# Patient Record
Sex: Female | Born: 1970 | Race: Black or African American | Hispanic: No | Marital: Single | Smoking: Never smoker
Health system: Southern US, Community
[De-identification: ages and names within clinical notes are randomized; demographics above are authoritative.]

## PROBLEM LIST (undated history)

## (undated) DIAGNOSIS — M7120 Synovial cyst of popliteal space [Baker], unspecified knee: Secondary | ICD-10-CM

## (undated) DIAGNOSIS — K219 Gastro-esophageal reflux disease without esophagitis: Secondary | ICD-10-CM

## (undated) DIAGNOSIS — I82409 Acute embolism and thrombosis of unspecified deep veins of unspecified lower extremity: Secondary | ICD-10-CM

## (undated) DIAGNOSIS — R011 Cardiac murmur, unspecified: Secondary | ICD-10-CM

## (undated) DIAGNOSIS — B009 Herpesviral infection, unspecified: Secondary | ICD-10-CM

## (undated) DIAGNOSIS — A6004 Herpesviral vulvovaginitis: Secondary | ICD-10-CM

## (undated) HISTORY — DX: Cardiac murmur, unspecified: R01.1

## (undated) HISTORY — DX: Herpesviral vulvovaginitis: A60.04

## (undated) HISTORY — DX: Herpesviral infection, unspecified: B00.9

## (undated) HISTORY — PX: BUNIONECTOMY: SHX129

## (undated) HISTORY — DX: Gastro-esophageal reflux disease without esophagitis: K21.9

---

## 2012-11-03 ENCOUNTER — Encounter: Payer: Self-pay | Admitting: Family Medicine

## 2012-11-03 ENCOUNTER — Ambulatory Visit (INDEPENDENT_AMBULATORY_CARE_PROVIDER_SITE_OTHER): Payer: BLUE CROSS/BLUE SHIELD | Admitting: Family Medicine

## 2012-11-03 ENCOUNTER — Encounter: Payer: Self-pay | Admitting: *Deleted

## 2012-11-03 VITALS — BP 112/74 | HR 78 | Ht 63.0 in | Wt 178.0 lb

## 2012-11-03 DIAGNOSIS — L609 Nail disorder, unspecified: Secondary | ICD-10-CM

## 2012-11-03 DIAGNOSIS — N949 Unspecified condition associated with female genital organs and menstrual cycle: Secondary | ICD-10-CM

## 2012-11-03 DIAGNOSIS — Z0000 Encounter for general adult medical examination without abnormal findings: Secondary | ICD-10-CM

## 2012-11-03 DIAGNOSIS — E669 Obesity, unspecified: Secondary | ICD-10-CM

## 2012-11-03 DIAGNOSIS — N9489 Other specified conditions associated with female genital organs and menstrual cycle: Secondary | ICD-10-CM

## 2012-11-03 NOTE — Progress Notes (Signed)
  Subjective:    Patient ID: Casey Farmer, female    DOB: October 28, 1970, 42 y.o.   MRN: [id number]  HPI  Casey Farmer is here today to establish care with our practice.  She was referred to [place] by her friend [name]).  She used to receive care from Nolon Stalls who is a PA in an office that is too far for her to drive.  Overall, she feels healthy except for her weight. She would like to start a medically monitored weight loss program.    Review of Systems  Constitutional: Positive for fatigue and unexpected weight change. Negative for activity change and appetite change.  HENT: Negative.   Eyes: Negative.   Respiratory: Negative.   Cardiovascular: Negative.   Gastrointestinal: Negative.   Genitourinary: Negative.   Musculoskeletal: Negative.   Skin: Positive for wound.  Psychiatric/Behavioral: Negative.     Past Medical History  Diagnosis Date  . Type 2 HSV infection of vulvovaginal region   . Heart murmur     Family History  Problem Relation Age of Onset  . Hyperlipidemia Mother   . Hypertension Mother   . Hypertension Father   . Kidney disease Father   . Heart disease Father   . 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 No distress.  HENT:  Head: Normocephalic.  Eyes: No scleral icterus.  Neck: No thyromegaly present.  Cardiovascular: Normal rate, regular rhythm and normal heart sounds.   Pulmonary/Chest: Effort normal and breath sounds normal.  Abdominal: There is no  tenderness.  Musculoskeletal: She exhibits no edema and no tenderness.  Neurological: She is alert.  Skin: Skin is warm and dry.  Psychiatric: She has a normal mood and affect. Her behavior is normal. Judgment and thought content normal.      Assessment & Plan:

## 2012-11-03 NOTE — Patient Instructions (Addendum)
1)  Weight Loss - Read about the HCG diet in "Pounds & Inches" which will be emailed to [email or web address].     Diet Following Bariatric Surgery The bariatric diet is designed to provide fluids and nourishment while promoting weight loss after bariatric surgery. The diet is divided into 3 stages. The rate of progression varies based on individual food tolerance. DIET FOLLOWING BARIATRIC SURGERY The diet following surgery is divided into 3 stages to allow a gradual adjustment. It is very important to the success of your surgery to:  Progress to each stage slowly.  Eat at set times.  Chew food well and stop eating when you are full.  Not drink liquids 30 minutes before and after meals. If you feel tightness or pressure in your chest, that means you are full. Wait 30 minutes before you try to eat again. STAGE 1 BARIATRIC DIET - ABOUT 2 WEEKS IN DURATION   The diet begins the day of surgery. It will last about 1 to 2 weeks after surgery. Your surgeon may have individual guidelines for you about specific foods or the progression of your diet. Follow your surgeon's guidelines.  If clear liquids are well-tolerated without vomiting, your caregiver will add a 4 oz to 6 oz high protein, low-calorie liquid supplement. You could add this to your meal plan 3 times daily. You will need at least 60 g to 80 g of protein daily or as determined by your Registered Dietitian.  Guidelines for choosing a protein supplement include:  At least 15 g of protein per 8 oz serving.  Less than 20 g total carbohydrate per 8 oz serving.  Less than 5 g fat per 8 oz serving.  Avoid carbonated beverages, caffeine, alcohol, and concentrated sweets such as sugar, cakes, and cookies.  Right after surgery, you may only be able to eat 3 to 4 tsp per meal. Your maximum volume should not exceed  to  cup total. Do not eat or drink more than 1 oz or 2 tbs every 15 minutes.  Take a chewable multivitamin and mineral  supplement.  Drink at least 48 oz of fluid daily, which includes your protein supplement. Food and beverages from the list below are allowed at set times (for example at 8 AM, 12 noon, or 5 PM):  Decaffeinated coffee or tea.  100% fruit juice.  Diet or sugar-free drinks.  Broth.  Blenderized soup.  Skim milk or lactose-free milk.  Sugar-free gelatin dessert or frozen ice pops.  Mashed potatoes.  Yogurt (artificially sweetened).  Sugar-free pudding.  Blended low-fat cottage cheese.  Unsweetened applesauce, grits, or hot wheat cereal. Four to six ounces of a liquid protein supplement from the list below is recommended for snacks at 10 AM, 2 PM, and 8 PM.  STAGE 2 BARIATRIC DIET (SOFT DIET) - ABOUT 4 WEEKS IN DURATION  About 2 weeks after surgery, your caregiver will progress your diet to this stage. Foods may need to be blended to the consistency of applesauce. Choose low-fat foods (less than 5 g of fat per serving) and avoid concentrated sweets and sugar (less than 10 g of sugar per serving). Meals should not exceed  to  cup total. This stage will last about 4 weeks. It is recommended that you meet with your dietitian at this stage to begin preparation for the last stage. This stage consists of 3 meals a day with a liquid protein supplement between meals twice daily. Do not drink liquids with foods. You must wait 30  minutes for the stomach pouch to empty before drinking. Chew food well. The food must be almost liquified before swallowing. Soft foods from the list below can now be slowly added to your diet:  Soft fruit (soft canned fruit in light syrup or natural juice, banana, melon, peaches, pears, or strawberries).  Cooked vegetables.  Toast or crackers (becomes soft after chewing 20 times).  Hot wheat cereal.  Fish.  Eggs (scrambled, soft-boiled). STAGE 3 BARIATRIC DIET (REGULAR DIET) - ABOUT 6 to 8 WEEKS AFTER SURGERY About 6 to 8 weeks after surgery, you will be  advanced to food that is regular in texture. This diet should include all food groups. The diet will continue to promote weight loss. Meals should not exceed  to 1 cup total. Your dietitian will be available to assist you in meal planning and additional behavioral strategies to make this final stage a long-term success. Slowly add foods of regular consistency and remember:  Eat only at your chosen meal times.  Minimize drinking with meals. You should drink 30 minutes before eating. Do not start drinking again for about 2 hours after eating.  Chew food well. Take small bites.  Think about the portion size of a healthy frozen meal. You will be able to eat most of this.  Make sure your meal is balanced with starch, protein, fruits, and vegetables.  When you feel full, stop eating. Document Released: 10/14/2002 Document Revised: 07/02/2011 Document Reviewed: 07/07/2010 [name] Patient Information 2014 [place], [place]. vv

## 2012-11-05 LAB — CBC WITH DIFFERENTIAL/PLATELET
Basophils Absolute: 0 10*3/uL (ref 0.0–0.1)
Basophils Relative: 1 % (ref 0–1)
Eosinophils Absolute: 0.1 10*3/uL (ref 0.0–0.7)
Eosinophils Relative: 3 % (ref 0–5)
HCT: 36.6 % (ref 36.0–46.0)
Hemoglobin: 12.3 g/dL (ref 12.0–15.0)
Lymphocytes Relative: 42 % (ref 12–46)
Lymphs Abs: 2.4 10*3/uL (ref 0.7–4.0)
MCH: 29.9 pg (ref 26.0–34.0)
MCHC: 33.6 g/dL (ref 30.0–36.0)
MCV: 88.8 fL (ref 78.0–100.0)
Monocytes Absolute: 0.4 10*3/uL (ref 0.1–1.0)
Monocytes Relative: 7 % (ref 3–12)
Neutro Abs: 2.7 10*3/uL (ref 1.7–7.7)
Neutrophils Relative %: 47 % (ref 43–77)
Platelets: 253 10*3/uL (ref 150–400)
RBC: 4.12 MIL/uL (ref 3.87–5.11)
RDW: 14.2 % (ref 11.5–15.5)
WBC: 5.6 10*3/uL (ref 4.0–10.5)

## 2012-11-05 LAB — COMPLETE METABOLIC PANEL WITH GFR
ALT: 16 U/L (ref 0–35)
AST: 20 U/L (ref 0–37)
Albumin: 4 g/dL (ref 3.5–5.2)
Alkaline Phosphatase: 70 U/L (ref 39–117)
BUN: 10 mg/dL (ref 6–23)
CO2: 31 mEq/L (ref 19–32)
Calcium: 9.4 mg/dL (ref 8.4–10.5)
Chloride: 101 mEq/L (ref 96–112)
Creat: 0.85 mg/dL (ref 0.50–1.10)
GFR, Est African American: >89 mL/min
GFR, Est Non African American: 85 mL/min
Glucose, Bld: 76 mg/dL (ref 70–99)
Potassium: 4 mEq/L (ref 3.5–5.3)
Sodium: 136 mEq/L (ref 135–145)
Total Bilirubin: 0.4 mg/dL (ref 0.3–1.2)
Total Protein: 7.1 g/dL (ref 6.0–8.3)

## 2012-11-05 LAB — LIPID PANEL
Cholesterol: 174 mg/dL (ref 0–200)
HDL: 54 mg/dL (ref >39)
LDL Cholesterol: 106 mg/dL — ABNORMAL HIGH (ref 0–99)
Total CHOL/HDL Ratio: 3.2 Ratio
Triglycerides: 72 mg/dL (ref <150)
VLDL: 14 mg/dL (ref 0–40)

## 2012-11-05 LAB — TSH: TSH: 0.754 u[IU]/mL (ref 0.350–4.500)

## 2012-11-05 LAB — HSV(HERPES SIMPLEX VRS) I + II AB-IGG
HSV 1 Glycoprotein G Ab, IgG: 5.94 IV — ABNORMAL HIGH
HSV 2 Glycoprotein G Ab, IgG: 5.57 IV — ABNORMAL HIGH

## 2012-11-05 LAB — VITAMIN D 25 HYDROXY (VIT D DEFICIENCY, FRACTURES): Vit D, 25-Hydroxy: 27 ng/mL — ABNORMAL LOW (ref 30–89)

## 2012-11-17 ENCOUNTER — Encounter: Payer: Self-pay | Admitting: Family Medicine

## 2012-11-17 DIAGNOSIS — N949 Unspecified condition associated with female genital organs and menstrual cycle: Secondary | ICD-10-CM | POA: Insufficient documentation

## 2012-11-17 DIAGNOSIS — E669 Obesity, unspecified: Secondary | ICD-10-CM | POA: Insufficient documentation

## 2012-11-17 DIAGNOSIS — Z0000 Encounter for general adult medical examination without abnormal findings: Secondary | ICD-10-CM | POA: Insufficient documentation

## 2012-11-17 DIAGNOSIS — L609 Nail disorder, unspecified: Secondary | ICD-10-CM | POA: Insufficient documentation

## 2012-11-17 NOTE — Assessment & Plan Note (Signed)
She is interested in losing weight. A copy of "Pounds & Inches" was mailed to her.

## 2012-11-17 NOTE — Assessment & Plan Note (Signed)
Checking to confirm if she has HSV 1 or 2 or both.

## 2012-11-17 NOTE — Assessment & Plan Note (Addendum)
She applies Nuvail to her nails.

## 2012-11-17 NOTE — Assessment & Plan Note (Signed)
She is to return for a CPE and to go over her labs.

## 2012-11-18 ENCOUNTER — Encounter: Payer: Self-pay | Admitting: Family Medicine

## 2012-11-18 ENCOUNTER — Ambulatory Visit (INDEPENDENT_AMBULATORY_CARE_PROVIDER_SITE_OTHER): Payer: BC Managed Care – PPO | Admitting: Family Medicine

## 2012-11-18 VITALS — BP 108/68 | HR 73 | Resp 16 | Ht 63.0 in | Wt 179.0 lb

## 2012-11-18 DIAGNOSIS — K140 Glossitis: Secondary | ICD-10-CM

## 2012-11-18 DIAGNOSIS — B009 Herpesviral infection, unspecified: Secondary | ICD-10-CM

## 2012-11-18 MED ORDER — FAMCICLOVIR 500 MG PO TABS: ORAL_TABLET | ORAL | Status: DC

## 2012-11-18 MED ORDER — ACYCLOVIR-HYDROCORTISONE 5-1 % EX CREA: TOPICAL_CREAM | CUTANEOUS | Status: DC

## 2012-11-18 MED ORDER — VALACYCLOVIR HCL 1 G PO TABS: ORAL_TABLET | ORAL | Status: DC

## 2012-11-18 MED ORDER — CLOTRIMAZOLE 10 MG MT TROC: 10.0000 mg | Freq: Every day | OROMUCOSAL | Status: DC

## 2012-11-18 NOTE — Patient Instructions (Addendum)
1)  Herpes I/II   Herpes I   Valtrex - 2 Grams at onset of symptoms and repeat in 12 hours x 1 day (Treatment)  Herpes II  Valtrex - 1/2 tab twice a day for 3 days (Treatment)  Valtrex - 1/2 - 1 daily for prevention    Famvir is not officially indicated for PREVENTION OF TRANSMISSION.    A supplement that can prevent outbreaks is Lysine.    A topical that can help is Nolon Rod that you use 5 x per day.      Herpes Labialis You have a fever blister or cold sore (herpes labialis). These painful, grouped sores are caused by one of the herpes viruses (HSV1 most commonly). They are usually found around the lips and mouth, but the same infection can also affect other areas on the face such as the nose and eyes. Herpes infections take about 10 days to heal. They often occur again and again in the same spot. Other symptoms may include numbness and tingling in the involved skin, achiness, fever, and swollen glands in the neck. Colds, emotional stress, injuries, or excess sunlight exposure all seem to make herpes reappear. Herpes lip infections are contagious. Direct contact with these sores can spread the infection. It can also be spread to other parts of your own body. TREATMENT  Herpes labialis is usually self-limited and resolves within 1 week. To reduce pain and swelling, apply ice packs frequently to the sores or suck on popsicles or frozen juice bars. Antiviral medicine may be used by mouth to shorten the duration of the breakout. Avoid spreading the infection by washing your hands often. Be careful not to touch your eyes or genital areas after handling the infected blisters. Do not kiss or have other intimate contact with others. After the blisters are completely healed you may resume contact. Use sunscreen to lessen recurrences.  If this is your first infection with herpes, or if you have a severe or repeated infections, your caregiver may prescribe one of the anti-viral drugs to speed up the  healing. If you have sun-related flare-ups despite the use of sunscreen, starting oral anti-viral medicine before a prolonged exposure (going skiing or to the beach) can prevent most episodes.  SEEK IMMEDIATE MEDICAL CARE IF:  You develop a headache, sleepiness, high fever, vomiting, or severe weakness.  You have eye irritation, pain, blurred vision or redness.  You develop a prolonged infection not getting better in 10 days. Document Released: 04/09/2005 Document Revised: 07/02/2011 Document Reviewed: 02/11/2009 [facility] Patient Information 2014 [place], [place].  Genital Herpes Genital herpes is a sexually transmitted disease. This means that it is a disease passed by having sex with an infected person. There is no cure for genital herpes. The time between attacks can be months to years. The virus may live in a person but produce no problems (symptoms). This infection can be passed to a baby as it travels down the birth canal (vagina). In a newborn, this can cause central nervous system damage, eye damage, or even death. The virus that causes genital herpes is usually HSV-2 virus. The virus that causes oral herpes is usually HSV-1. The diagnosis (learning what is wrong) is made through culture results. SYMPTOMS  Usually symptoms of pain and itching begin a few days to a week after contact. It first appears as small blisters that progress to small painful ulcers which then scab over and heal after several days. It affects the outer genitalia, birth canal, cervix, penis, anal  area, buttocks, and thighs. HOME CARE INSTRUCTIONS   Keep ulcerated areas dry and clean.  Take medications as directed. Antiviral medications can speed up healing. They will not prevent recurrences or cure this infection. These medications can also be taken for suppression if there are frequent recurrences.  While the infection is active, it is contagious. Avoid all sexual contact during active infections.  Condoms may  help prevent spread of the herpes virus.  Practice safe sex.  Wash your hands thoroughly after touching the genital area.  Avoid touching your eyes after touching your genital area.  Inform your caregiver if you have had genital herpes and become pregnant. It is your responsibility to insure a safe outcome for your baby in this pregnancy.  Only take over-the-counter or prescription medicines for pain, discomfort, or fever as directed by your caregiver. SEEK MEDICAL CARE IF:   You have a recurrence of this infection.  You do not respond to medications and are not improving.  You have new sources of pain or discharge which have changed from the original infection.  You have an oral temperature above 102 F (38.9 C).  You develop abdominal pain.  You develop eye pain or signs of eye infection. Document Released: 04/06/2000 Document Revised: 07/02/2011 Document Reviewed: 04/27/2009 [facility] Patient Information 2014 [place], [place].

## 2012-11-18 NOTE — Progress Notes (Signed)
  Subjective:    Patient ID: Casey Farmer, female    DOB: 09/05/1970, 42 y.o.   MRN: [id number]  HPI  [name] is here today to go over her lab results and to discuss the condition listed below:   1)  HSV:  She does not currently take anything for this.    2)  Tongue Problem:  She has developed some dark spots on her tongue and thinks that it might be a fungal infection.       Review of Systems  Constitutional: Negative for activity change, fatigue and unexpected weight change.       Brown spot in her tongue  HENT: Negative.   Eyes: Negative.   Respiratory: Negative for shortness of breath.   Cardiovascular: Negative for chest pain, palpitations and leg swelling.  Gastrointestinal: Negative for diarrhea and constipation.  Endocrine: Negative.   Genitourinary: Negative for difficulty urinating.  Musculoskeletal: Negative.   Skin: Negative for wound.  Neurological: Negative.   Hematological: Negative for adenopathy. Does not bruise/bleed easily.  Psychiatric/Behavioral: Negative for sleep disturbance and dysphoric mood. The patient is not nervous/anxious.     Past Medical History  Diagnosis Date  . Type 2 HSV infection of vulvovaginal region   . Heart murmur   . HSV-1 (herpes simplex virus 1) infection     Family History  Problem Relation Age of Onset  . Hyperlipidemia Mother   . Hypertension Mother   . Hypertension Father   . Kidney disease Father     S/P Kidney Transplant   . Heart disease Father   . 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 Constitutional: She is oriented to person, place, and time.  HENT:  Mouth/Throat:    Eyes: Conjunctivae are normal. No scleral icterus.  Neck: Neck supple. No thyromegaly present.  Cardiovascular: Normal rate, regular rhythm and normal heart sounds.   Pulmonary/Chest: Effort normal and breath sounds normal.  Musculoskeletal: She exhibits no edema and no tenderness.  Lymphadenopathy:    She has no cervical adenopathy.  Neurological: She is alert and oriented to person, place, and time.  Skin: Skin is warm and dry.  Psychiatric: She has a normal mood and affect. Her behavior is normal. Judgment and thought content normal.          Assessment & Plan:

## 2013-01-04 ENCOUNTER — Encounter: Payer: Self-pay | Admitting: Family Medicine

## 2013-01-04 DIAGNOSIS — B009 Herpesviral infection, unspecified: Secondary | ICD-10-CM | POA: Insufficient documentation

## 2013-01-05 DIAGNOSIS — K140 Glossitis: Secondary | ICD-10-CM | POA: Insufficient documentation

## 2013-01-07 NOTE — Assessment & Plan Note (Signed)
She was given prescriptions for Famvir and Nolon Rod.

## 2013-01-07 NOTE — Assessment & Plan Note (Signed)
She has been on Cleocin which may have caused a fungal infection.  She was given Mycelex troches to help with this problem.

## 2013-01-07 NOTE — Assessment & Plan Note (Signed)
We discussed the fact that she has tested positive for both HSV 1 & 2.  I explained to her that Valtrex is the medication she needs to take to prevent transmission of the HSV.

## 2013-01-21 ENCOUNTER — Encounter (HOSPITAL_BASED_OUTPATIENT_CLINIC_OR_DEPARTMENT_OTHER): Payer: Self-pay | Admitting: Emergency Medicine

## 2013-01-21 ENCOUNTER — Emergency Department (HOSPITAL_BASED_OUTPATIENT_CLINIC_OR_DEPARTMENT_OTHER)
Admission: EM | Admit: 2013-01-21 | Discharge: 2013-01-21 | Disposition: A | Discharge status: Home / Self Care | Payer: BC Managed Care – PPO | Attending: Emergency Medicine | Admitting: Emergency Medicine

## 2013-01-21 DIAGNOSIS — F4489 Other dissociative and conversion disorders: Secondary | ICD-10-CM | POA: Insufficient documentation

## 2013-01-21 DIAGNOSIS — Z792 Long term (current) use of antibiotics: Secondary | ICD-10-CM | POA: Insufficient documentation

## 2013-01-21 DIAGNOSIS — Z8619 Personal history of other infectious and parasitic diseases: Secondary | ICD-10-CM | POA: Insufficient documentation

## 2013-01-21 DIAGNOSIS — R011 Cardiac murmur, unspecified: Secondary | ICD-10-CM | POA: Insufficient documentation

## 2013-01-21 DIAGNOSIS — M7122 Synovial cyst of popliteal space [Baker], left knee: Secondary | ICD-10-CM

## 2013-01-21 DIAGNOSIS — Z880 Allergy status to penicillin: Secondary | ICD-10-CM | POA: Insufficient documentation

## 2013-01-21 DIAGNOSIS — M7120 Synovial cyst of popliteal space [Baker], unspecified knee: Secondary | ICD-10-CM | POA: Insufficient documentation

## 2013-01-21 DIAGNOSIS — Z79899 Other long term (current) drug therapy: Secondary | ICD-10-CM | POA: Insufficient documentation

## 2013-01-21 LAB — D-DIMER, QUANTITATIVE: D-Dimer, Quant: <0.27 ug/mL-FEU (ref 0.00–0.48)

## 2013-01-21 NOTE — ED Provider Notes (Signed)
CSN: [id number]     Arrival date & time 01/21/13  1448 History   First MD Initiated Contact with Patient 01/21/13 1459     Chief Complaint  Patient presents with  . Leg Swelling   (Consider location/radiation/quality/duration/timing/severity/associated sxs/prior Treatment) HPI  This is a 42 year old female who presents with one month of left ankle swelling, one day of posterior knee swelling and pain as well as burning sensation. Patient is currently on clindamycin following a root canal. Patient states that when she gets on antibiotics she frequently has a burning sensation throughout her body. This is not new.  Patient reports a one-month history of mild left lower extremity swelling. She is seeing a Dr. who encouraged her to reduce her salt intake. Patient noted yesterday a swelling to the posterior left knee. She denies any injury. She denies any manual labor. Patient states that she is scared that she may have a blood clot. She has no history of blood clots, she's not currently on estrogen, she has no recent history of travel or hospitalization. She denies any chest pain or shortness of breath.  Past Medical History  Diagnosis Date  . Type 2 HSV infection of vulvovaginal region   . Heart murmur   . HSV-1 (herpes simplex virus 1) infection    Past Surgical History  Procedure Laterality Date  . Bunionectomy Bilateral    Family History  Problem Relation Age of Onset  . Hyperlipidemia Mother   . Hypertension Mother   . Hypertension Father   . Kidney disease Father     S/P Kidney Transplant   . Heart disease Father   . Cancer Maternal Grandmother    History  Substance Use Topics  . Smoking status: Never Smoker   . Smokeless tobacco: Never Used  . Alcohol Use: 0.0 oz/week    .5 drink(s) per week     Comment: Occasionally   OB History   Grav Para Term Preterm Abortions TAB SAB Ect Mult Living                 Review of Systems  Constitutional: Negative for fever.   Respiratory: Negative for cough, chest tightness and shortness of breath.   Cardiovascular: Negative for chest pain.  Gastrointestinal: Negative for abdominal pain.  Genitourinary: Negative for dysuria.  Musculoskeletal: Negative for back pain and joint swelling.  Psychiatric/Behavioral: Positive for confusion.  All other systems reviewed and are negative.    Allergies  Amoxicillin  Home Medications   Current Outpatient Rx  Name  Route  Sig  Dispense  Refill  . Acyclovir-Hydrocortisone 5-1 % CREA      Apply to herpes lesion up to 5 x per day   5 g   11   . clindamycin (CLEOCIN) 150 MG capsule               . clotrimazole (MYCELEX) 10 MG troche   Oral   Take 1 tablet (10 mg total) by mouth 5 (five) times daily.   35 tablet   0   . Dermatological Products, Misc. (NUVAIL) SOLN               . famciclovir (FAMVIR) 500 MG tablet      Take 1/2 - 1 tab daily for prevention or 3 tabs at onset of symptoms x 1 day   30 tablet   11   . valACYclovir (VALTREX) 1000 MG tablet               .  valACYclovir (VALTREX) 1000 MG tablet      Take 1/2 - 1 tab daily for prevention or as directed for treatment   30 tablet   11    BP 137/59  Pulse 90  Temp(Src) 98.6 F (37 C) (Oral)  Resp 18  Wt 179 lb (81.194 kg)  BMI 31.72 kg/m2  SpO2 100%  LMP 01/10/2013 Physical Exam  Nursing note and vitals reviewed. Constitutional: She is oriented to person, place, and time. She appears well-developed and well-nourished. No distress.  HENT:  Head: Normocephalic and atraumatic.  Cardiovascular: Normal rate, regular rhythm and normal heart sounds.   No murmur heard. Pulmonary/Chest: Effort normal and breath sounds normal. No respiratory distress.  Abdominal: Soft. There is no tenderness.  Musculoskeletal:  Minimal swelling noted to the left ankle. Good DP pulses.  There is fullness and tenderness to palpation to the left popliteal fossa.  No unilateral calf swelling. No  tenderness to palpation of the calves.  Neurological: She is alert and oriented to person, place, and time.  Skin: Skin is warm and dry.  Psychiatric: She has a normal mood and affect.    ED Course  Procedures (including critical care time) Labs Review Labs Reviewed  D-DIMER, QUANTITATIVE   Imaging Review No results found.  MDM   1. Baker's cyst of knee, left     This is a 42 year old female who presents with concerns for a DVT. She is low-risk for DVT and clinically there is no swelling of the calf. Patient is a fullness to the popliteal fossa but presentation is more consistent with a Baker's cyst. D-dimer was sent.  D-dimer is negative. At this time patient does not need further workup for DVT. Patient will followup with primary care physician.  After history, exam, and medical workup I feel the patient has been appropriately medically screened and is safe for discharge home. Pertinent diagnoses were discussed with the patient. Patient was given return precautions.     Shon Baton, MD 01/21/13 2123

## 2013-01-21 NOTE — ED Notes (Signed)
MD at bedside. 

## 2013-01-21 NOTE — ED Notes (Signed)
Pt reports swelling behind knees x 1 mo, today she reports burning sensation as well. Taking antibiotics for root canal.

## 2013-02-02 ENCOUNTER — Ambulatory Visit (INDEPENDENT_AMBULATORY_CARE_PROVIDER_SITE_OTHER): Payer: BC Managed Care – PPO | Admitting: Family Medicine

## 2013-02-02 ENCOUNTER — Encounter: Payer: Self-pay | Admitting: Family Medicine

## 2013-02-02 VITALS — BP 102/68 | HR 80 | Resp 16 | Wt 178.0 lb

## 2013-02-02 DIAGNOSIS — M549 Dorsalgia, unspecified: Secondary | ICD-10-CM

## 2013-02-02 DIAGNOSIS — M7122 Synovial cyst of popliteal space [Baker], left knee: Secondary | ICD-10-CM

## 2013-02-02 DIAGNOSIS — Z23 Encounter for immunization: Secondary | ICD-10-CM

## 2013-02-02 DIAGNOSIS — M7120 Synovial cyst of popliteal space [Baker], unspecified knee: Secondary | ICD-10-CM

## 2013-02-02 DIAGNOSIS — J309 Allergic rhinitis, unspecified: Secondary | ICD-10-CM

## 2013-02-02 MED ORDER — MOMETASONE FUROATE 50 MCG/ACT NA SUSP: NASAL | Status: DC

## 2013-02-02 MED ORDER — MONTELUKAST SODIUM 10 MG PO TABS: 10.0000 mg | ORAL_TABLET | Freq: Every day | ORAL | Status: DC

## 2013-02-02 MED ORDER — MELOXICAM 7.5 MG PO TABS: 7.5000 mg | ORAL_TABLET | Freq: Every day | ORAL | Status: DC

## 2013-02-02 NOTE — Patient Instructions (Addendum)
1)  Leg Pain/Swelling - Call and schedule an appointment with [facility] to evaluate left leg for ? Baker's Cyst.  In the meantime, you can take an anti-inflammatory and wrap your knee with Cobain and elevate as much as you can.    2)  Back Pain - Take a combination of the anti-inflammatory and add Tylenol.  Use the Back Magic as we discussed 5-10 mins 1-2 times per day.  3)  Nasal Congestion - Rinse out nasal passage with Lloyd Huger Med Sinus Rinse (Distilled Water and Viacom).    Nasal Swelling - Nasal Steroid Sprays (Nasonex vs Nasacort)  Congestion - Singulair +/- Oral Decongestant (Pseudoephedrine or Phenylephrine) You can get plain or mixed e.g. Zyrtec D/Noral AD  Runny, Itching, Sneezing - Antihistamine (Zyrtec vs Noral AD)    Sinus Headache A sinus headache is when your sinuses become clogged or swollen. Sinus headaches can range from mild to severe.  CAUSES A sinus headache can have different causes, such as:  Colds.  Sinus infections.  Allergies. SYMPTOMS  Symptoms of a sinus headache may vary and can include:  Headache.  Pain or pressure in the face.  Congested or runny nose.  Fever.  Inability to smell.  Pain in upper teeth. Weather changes can make symptoms worse. TREATMENT  The treatment of a sinus headache depends on the cause.  Sinus pain caused by a sinus infection may be treated with antibiotic medicine.  Sinus pain caused by allergies may be helped by allergy medicines (antihistamines) and medicated nasal sprays.  Sinus pain caused by congestion may be helped by flushing the nose and sinuses with saline solution. HOME CARE INSTRUCTIONS   If antibiotics are prescribed, take them as directed. Finish them even if you start to feel better.  Only take over-the-counter or prescription medicines for pain, discomfort, or fever as directed by your caregiver.  If you have congestion, use a nasal spray to help reduce pressure. SEEK IMMEDIATE MEDICAL CARE  IF:  You have a fever.  You have headaches more than once a week.  You have sensitivity to light or sound.  You have repeated nausea and vomiting.  You have vision problems.  You have sudden, severe pain in your face or head.  You have a seizure.  You are confused.  Your sinus headaches do not get better after treatment. Many people think they have a sinus headache when they actually have migraines or tension headaches. MAKE SURE YOU:   Understand these instructions.  Will watch your condition.  Will get help right away if you are not doing well or get worse. Document Released: 05/17/2004 Document Revised: 07/02/2011 Document Reviewed: 07/08/2010 [facility] Patient Information 2014 [place], [place].

## 2013-02-02 NOTE — Progress Notes (Signed)
Subjective:    Patient ID: Casey Farmer, female    DOB: 09-28-1970, 42 y.o.   MRN: [id number]  HPI  Chelly is here today to discuss the conditions listed below:   1)  Baker's Cyst:  She first noticed a "swelling" in the back of her left knee about three weeks ago.  She was worried that she might have a blood clot so she went to the ED downstairs to have it evaluated.  She was told that she did not have a blood clot but that she most likely has a "Baker's Cyst".  She was instructed to follow up with her PCP.   2)  Back Pain:  She has been having pain in the middle of her back for several months.  She says that she feels that the pain is coming from poor posture and from working at a small deck in her home.    3)  Sinus Pressure/Congestion:  She has trouble with her sinuses and wants to be referred to an ENT.      Review of Systems  Constitutional: Negative.   HENT: Positive for congestion.   Eyes: Negative.   Respiratory: Negative.   Cardiovascular: Negative.   Gastrointestinal: Negative.   Endocrine: Negative.   Genitourinary: Negative.   Musculoskeletal: Positive for back pain.       Swelling in the back of her left knee   Allergic/Immunologic: Negative.   Neurological: Negative.   Hematological: Negative.   Psychiatric/Behavioral: Negative.      Past Medical History  Diagnosis Date  . Type 2 HSV infection of vulvovaginal region   . Heart murmur   . HSV-1 (herpes simplex virus 1) infection       Family History  Problem Relation Age of Onset  . Hyperlipidemia Mother   . Hypertension Mother   . Hypertension Father   . Kidney disease Father     S/P Kidney Transplant   . Heart disease Father   . Cancer Maternal Grandmother        Objective:   Physical Exam  Vitals reviewed. Constitutional: She is oriented to person, place, and time. She appears well-developed and well-nourished. No distress.  HENT:  Head: Normocephalic.  Mouth/Throat: No oropharyngeal  exudate.  Eyes: Conjunctivae are normal. Right eye exhibits no discharge. Left eye exhibits no discharge.  Neck: Neck supple.  Cardiovascular: Normal rate, regular rhythm and normal heart sounds.  Exam reveals no gallop and no friction rub.   No murmur heard. Pulmonary/Chest: Effort normal and breath sounds normal. She has no wheezes. She exhibits no tenderness.  Musculoskeletal: She exhibits edema (Mild swelling behind left knee) and tenderness.       Arms: Lymphadenopathy:    She has no cervical adenopathy.  Neurological: She is alert and oriented to person, place, and time.  Skin: Skin is warm and dry. No rash noted.  Psychiatric: She has a normal mood and affect.      Assessment & Plan:    Keyly was seen today for cyst and back pain.  Diagnoses and associated orders for this visit:  Baker's cyst of knee, left Comments: She is to call [facility] and schedule an appointment.    Backache Comments: She was given a prescription for Mobic.   - meloxicam (MOBIC) 7.5 MG tablet; Take 1 tablet (7.5 mg total) by mouth daily.  Allergic rhinitis Comments: We discussed several options for treatment for her nasal/sinus symptoms.   - mometasone (NASONEX) 50 MCG/ACT nasal spray; Spray twice into  each nostril at night for nasal/sinus congestion - montelukast (SINGULAIR) 10 MG tablet; Take 1 tablet (10 mg total) by mouth at bedtime.  Need for prophylactic vaccination and inoculation against influenza Comments: Flu shot was given without difficulty.  - Flu Vaccine QUAD 36+ mos PF IM (Fluarix)

## 2013-03-03 ENCOUNTER — Ambulatory Visit: Payer: BC Managed Care – PPO | Admitting: Family Medicine

## 2013-03-03 ENCOUNTER — Encounter: Payer: BC Managed Care – PPO | Admitting: Family Medicine

## 2013-03-03 ENCOUNTER — Encounter: Payer: Self-pay | Admitting: Family Medicine

## 2013-03-03 ENCOUNTER — Ambulatory Visit (INDEPENDENT_AMBULATORY_CARE_PROVIDER_SITE_OTHER): Payer: BC Managed Care – PPO | Admitting: Family Medicine

## 2013-03-03 VITALS — BP 125/80 | HR 69 | Resp 16 | Wt 178.0 lb

## 2013-03-03 DIAGNOSIS — M25562 Pain in left knee: Secondary | ICD-10-CM

## 2013-03-03 DIAGNOSIS — M25569 Pain in unspecified knee: Secondary | ICD-10-CM

## 2013-03-03 NOTE — Patient Instructions (Signed)
1 

## 2013-03-03 NOTE — Progress Notes (Signed)
  Subjective:    Patient ID: Casey Farmer, female    DOB: 14-Mar-1971, 42 y.o.   MRN: [id number]  HPI  Casey Farmer is here today concerned about her left knee. She has been seen several times for pain and swelling.  She feels a knot behind it. She was told it was probably a Bakers Cyst. She just wants to be sure it's nothing more serious. She is also concerned about her tongue. She says that it has indentions.     Review of Systems  Constitutional: Negative.   HENT: Negative.   Eyes: Negative.   Respiratory: Negative.   Cardiovascular: Negative.   Gastrointestinal: Negative.   Endocrine: Negative.   Genitourinary: Negative.   Musculoskeletal:       Swelling behind left knee  Skin: Negative.   Allergic/Immunologic: Negative.   Neurological: Negative.   Hematological: Negative.   Psychiatric/Behavioral: Negative.      Past Medical History  Diagnosis Date  . Type 2 HSV infection of vulvovaginal region   . Heart murmur   . HSV-1 (herpes simplex virus 1) infection      Family History  Problem Relation Age of Onset  . Hyperlipidemia Mother   . Hypertension Mother   . Hypertension Father   . Kidney disease Father     S/P Kidney Transplant   . Heart disease Father   . 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    Education: Oncologist [facility]); [employer] (Associate's Degree) IT    Tobacco Use/Exposure:  None    Alcohol Use:  Occasional   Drug Use:  None   Diet:  Regular   Exercise:  Walking and Aerobics - Twice daily   Hobbies: Shopping and cooking                        Objective:   Physical Exam  Nursing note and vitals reviewed. Constitutional: She is oriented to person, place, and time. She appears well-developed and well-nourished.   HENT:  Head: Normocephalic.  Eyes: Pupils are equal, round, and reactive to light.  Neck: Normal range of motion.  Cardiovascular: Normal rate.   Pulmonary/Chest: Effort normal.  Abdominal: Soft.  Musculoskeletal: Normal range of motion.  Neurological: She is alert and oriented to person, place, and time.  Skin: Skin is warm and dry.  Psychiatric: She has a normal mood and affect. Her behavior is normal. Judgment and thought content normal.      Assessment & Plan:    Casey Farmer was seen today for knee pain.  Diagnoses and associated orders for this visit:  Knee pain, acute, left Comments: She was given the name and numbers for several orthopedists that she might want to see.     TIME SPENT "FACE TO FACE" WITH PATIENT -  15 MINS

## 2013-03-09 NOTE — Progress Notes (Signed)
This encounter was created in error - please disregard.

## 2013-03-17 ENCOUNTER — Ambulatory Visit: Payer: BC Managed Care – PPO | Admitting: Family Medicine

## 2013-03-23 ENCOUNTER — Other Ambulatory Visit: Payer: Self-pay | Admitting: Family Medicine

## 2013-03-27 ENCOUNTER — Encounter: Payer: Self-pay | Admitting: Family Medicine

## 2013-03-27 DIAGNOSIS — Z23 Encounter for immunization: Secondary | ICD-10-CM | POA: Insufficient documentation

## 2013-03-27 NOTE — Assessment & Plan Note (Signed)
The patient confirmed that they are not allergic to eggs and have never had a bad reaction with the flu shot in the past.  The vaccination was given without difficulty.   

## 2013-04-20 DIAGNOSIS — J309 Allergic rhinitis, unspecified: Secondary | ICD-10-CM | POA: Insufficient documentation

## 2013-04-20 DIAGNOSIS — M549 Dorsalgia, unspecified: Secondary | ICD-10-CM | POA: Insufficient documentation

## 2013-04-20 DIAGNOSIS — M7120 Synovial cyst of popliteal space [Baker], unspecified knee: Secondary | ICD-10-CM | POA: Insufficient documentation

## 2013-04-21 LAB — HM MAMMOGRAPHY: HM MAMMO: NEGATIVE

## 2013-04-24 ENCOUNTER — Encounter: Payer: Self-pay | Admitting: *Deleted

## 2013-05-22 ENCOUNTER — Encounter: Payer: Self-pay | Admitting: Family Medicine

## 2013-05-22 ENCOUNTER — Ambulatory Visit (INDEPENDENT_AMBULATORY_CARE_PROVIDER_SITE_OTHER): Payer: BC Managed Care – PPO | Admitting: Family Medicine

## 2013-05-22 VITALS — BP 118/74 | HR 71 | Resp 16 | Wt 175.0 lb

## 2013-05-22 DIAGNOSIS — K219 Gastro-esophageal reflux disease without esophagitis: Secondary | ICD-10-CM

## 2013-05-22 MED ORDER — RABEPRAZOLE SODIUM 20 MG PO TBEC: 20.0000 mg | DELAYED_RELEASE_TABLET | Freq: Every day | ORAL | Status: DC

## 2013-05-22 NOTE — Patient Instructions (Signed)
1)  GERD -   Mylanta, Maalox, Tums, Rolaids, Baking Soda (1 tsp) 8 oz water; GI Cocktail (Mylanta, Zantac, Bicarb, Levsin)   Zantac 150 mg 1-2 x per day or Pepcid 20 1-2 x per day  Omeprazole 20 mg (1-2) per day or the Aciphex 20 mg daily.     Gastroesophageal Reflux Disease, Adult Gastroesophageal reflux disease (GERD) happens when acid from your stomach flows up into the esophagus. When acid comes in contact with the esophagus, the acid causes soreness (inflammation) in the esophagus. Over time, GERD may create small holes (ulcers) in the lining of the esophagus. CAUSES   Increased body weight. This puts pressure on the stomach, making acid rise from the stomach into the esophagus.  Smoking. This increases acid production in the stomach.  Drinking alcohol. This causes decreased pressure in the lower esophageal sphincter (valve or ring of muscle between the esophagus and stomach), allowing acid from the stomach into the esophagus.  Late evening meals and a full stomach. This increases pressure and acid production in the stomach.  A malformed lower esophageal sphincter. Sometimes, no cause is found. SYMPTOMS   Burning pain in the lower part of the mid-chest behind the breastbone and in the mid-stomach area. This may occur twice a week or more often.  Trouble swallowing.  Sore throat.  Dry cough.  Asthma-like symptoms including chest tightness, shortness of breath, or wheezing. DIAGNOSIS  Your caregiver may be able to diagnose GERD based on your symptoms. In some cases, X-rays and other tests may be done to check for complications or to check the condition of your stomach and esophagus. TREATMENT  Your caregiver may recommend over-the-counter or prescription medicines to help decrease acid production. Ask your caregiver before starting or adding any new medicines.  HOME CARE INSTRUCTIONS   Change the factors that you can control. Ask your caregiver for guidance concerning  weight loss, quitting smoking, and alcohol consumption.  Avoid foods and drinks that make your symptoms worse, such as:  Caffeine or alcoholic drinks.  Chocolate.  Peppermint or mint flavorings.  Garlic and onions.  Spicy foods.  Citrus fruits, such as oranges, lemons, or limes.  Tomato-based foods such as sauce, chili, salsa, and pizza.  Fried and fatty foods.  Avoid lying down for the 3 hours prior to your bedtime or prior to taking a nap.  Eat small, frequent meals instead of large meals.  Wear loose-fitting clothing. Do not wear anything tight around your waist that causes pressure on your stomach.  Raise the head of your bed 6 to 8 inches with wood blocks to help you sleep. Extra pillows will not help.  Only take over-the-counter or prescription medicines for pain, discomfort, or fever as directed by your caregiver.  Do not take aspirin, ibuprofen, or other nonsteroidal anti-inflammatory drugs (NSAIDs). SEEK IMMEDIATE MEDICAL CARE IF:   You have pain in your arms, neck, jaw, teeth, or back.  Your pain increases or changes in intensity or duration.  You develop nausea, vomiting, or sweating (diaphoresis).  You develop shortness of breath, or you faint.  Your vomit is green, yellow, black, or looks like coffee grounds or blood.  Your stool is red, bloody, or black. These symptoms could be signs of other problems, such as heart disease, gastric bleeding, or esophageal bleeding. MAKE SURE YOU:   Understand these instructions.  Will watch your condition.  Will get help right away if you are not doing well or get worse. Document Released: 01/17/2005 Document  Revised: 07/02/2011 Document Reviewed: 10/27/2010 [facility] DepartmentExit[facility] Patient Information 2014 [place], [place].   Diet for Gastroesophageal Reflux Disease, Adult Reflux (acid reflux) is when acid from your stomach flows up into the esophagus. When acid comes in contact with the esophagus, the acid causes irritation  and soreness (inflammation) in the esophagus. When reflux happens often or so severely that it causes damage to the esophagus, it is called gastroesophageal reflux disease (GERD). Nutrition therapy can help ease the discomfort of GERD. FOODS OR DRINKS TO AVOID OR LIMIT  Smoking or chewing tobacco. Nicotine is one of the most potent stimulants to acid production in the gastrointestinal tract.  Caffeinated and decaffeinated coffee and black tea.  Regular or low-calorie carbonated beverages or energy drinks (caffeine-free carbonated beverages are allowed).   Strong spices, such as black pepper, white pepper, red pepper, cayenne, curry powder, and chili powder.  Peppermint or spearmint.  Chocolate.  High-fat foods, including meats and fried foods. Extra added fats including oils, butter, salad dressings, and nuts. Limit these to less than 8 tsp per day.  Fruits and vegetables if they are not tolerated, such as citrus fruits or tomatoes.  Alcohol.  Any food that seems to aggravate your condition. If you have questions regarding your diet, call your caregiver or a registered dietitian. OTHER THINGS THAT MAY HELP GERD INCLUDE:   Eating your meals slowly, in a relaxed setting.  Eating 5 to 6 small meals per day instead of 3 large meals.  Eliminating food for a period of time if it causes distress.  Not lying down until 3 hours after eating a meal.  Keeping the head of your bed raised 6 to 9 inches (15 to 23 cm) by using a foam wedge or blocks under the legs of the bed. Lying flat may make symptoms worse.  Being physically active. Weight loss may be helpful in reducing reflux in overweight or obese adults.  Wear loose fitting clothing EXAMPLE MEAL PLAN This meal plan is approximately 2,000 calories based on https://www.[place].org/ChooseMyPlate.gov meal planning guidelines. Breakfast   cup cooked oatmeal.  1 cup strawberries.  1 cup low-fat milk.  1 oz almonds. Snack  1 cup cucumber slices.  6  oz yogurt (made from low-fat or fat-free milk). Lunch  2 slice whole-wheat bread.  2 oz sliced [place]urkey.  2 tsp mayonnaise.  1 cup blueberries.  1 cup snap peas. Snack  6 whole-wheat crackers.  1 oz string cheese. Dinner   cup brown rice.  1 cup mixed veggies.  1 tsp olive oil.  3 oz grilled fish. Document Released: 04/09/2005 Document Revised: 07/02/2011 Document Reviewed: 02/23/2011 [facility] Patient Information 2014 [place]Care, [place]LL[place].

## 2013-05-22 NOTE — Progress Notes (Signed)
Subjective:    Patient ID: Casey Farmer, female    DOB: 1970-12-06, 43 y.o.   MRN: [id number]  HPI  [name] is here today to discuss the conditions listed below:   1)  GERD -  She has been struggling with what she feels is acid reflux for the past three weeks.  She also has been experiencing moderate belching and she feels "bloated".  She has been taking Zantac, Apple Cider Vinegar and she has increased her fluid intake but she has not seen much improvement in her symptoms.   2) Allergic Rhinitis - She has also been having rhinorrhea and nasal congestion off and on for a few weeks.  She has been using Flonase which has not helped her very much.  She has a prescription for Singulair and Nasonex but she has been hesitant to use them.     Review of Systems  HENT: Positive for congestion, postnasal drip and rhinorrhea.   Gastrointestinal:       Acid Reflux, bloated and belching more than usual.      Past Medical History  Diagnosis Date  . Type 2 HSV infection of vulvovaginal region   . Heart murmur   . HSV-1 (herpes simplex virus 1) infection      Past Surgical History  Procedure Laterality Date  . 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    Education: Oncologist [facility] [facility]); [employer] (Associate's Degree) IT    Tobacco Use/Exposure:  None    Alcohol Use:  Occasional   Drug Use:  None   Diet:  Regular   Exercise:  Walking and Aerobics - Twice daily   Hobbies: Shopping and cooking                     Family History  Problem Relation Age of Onset  . Hyperlipidemia Mother   . Hypertension Mother   . Hypertension Father   . Kidney disease Father     S/P Kidney Transplant   . Heart disease Father   . Cancer Maternal Grandmother      Current  Outpatient Prescriptions on File Prior to Visit  Medication Sig Dispense Refill  . Acyclovir-Hydrocortisone 5-1 % CREA Apply to herpes lesion up to 5 x per day  5 g  11  . famciclovir (FAMVIR) 500 MG tablet Take 1/2 - 1 tab daily for prevention or 3 tabs at onset of symptoms x 1 day  30 tablet  11  . meloxicam (MOBIC) 7.5 MG tablet Take 1 tablet (7.5 mg total) by mouth daily.  30 tablet  5  . mometasone (NASONEX) 50 MCG/ACT nasal spray Spray twice into each nostril at night for nasal/sinus congestion  17 g  11  . montelukast (SINGULAIR) 10 MG tablet Take 1 tablet (10 mg total) by mouth at bedtime.  30 tablet  11  . valACYclovir (VALTREX) 1000 MG tablet Take 1/2 - 1 tab daily for prevention or as directed for treatment  30 tablet  11   No current facility-administered medications on file prior to visit.     Allergies  Allergen Reactions  . Amoxicillin Swelling     Immunization History  Administered Date(s) Administered  . Influenza,inj,Quad PF,36+ Mos 02/02/2013  Objective:   Physical Exam  Nursing note and vitals reviewed. Constitutional: She appears well-nourished. No distress.  HENT:  Mouth/Throat: Oropharynx is clear and moist.  Neck: No thyromegaly present.  Cardiovascular: Normal rate, regular rhythm and normal heart sounds.   Pulmonary/Chest: Effort normal and breath sounds normal.  Abdominal: Soft. She exhibits no distension and no mass. There is tenderness. There is no rebound and no guarding.  Neurological: She is alert.  Skin: Skin is warm and dry.  Psychiatric: She has a normal mood and affect. Her behavior is normal. Judgment and thought content normal.          Assessment & Plan:    Elon JesterMichele was seen today for gastrophageal reflux.  Diagnoses and associated orders for this visit:  GERD (gastroesophageal reflux disease) - RABEprazole (ACIPHEX) 20 MG tablet; Take 1 tablet (20 mg total) by mouth daily.

## 2013-07-01 ENCOUNTER — Ambulatory Visit: Payer: BC Managed Care – PPO | Admitting: Family Medicine

## 2013-09-23 ENCOUNTER — Encounter: Payer: Self-pay | Admitting: Family Medicine

## 2013-09-23 ENCOUNTER — Ambulatory Visit (INDEPENDENT_AMBULATORY_CARE_PROVIDER_SITE_OTHER): Payer: BC Managed Care – PPO | Admitting: Family Medicine

## 2013-09-23 VITALS — BP 117/66 | HR 82 | Resp 16 | Ht 63.0 in | Wt 173.0 lb

## 2013-09-23 DIAGNOSIS — R682 Dry mouth, unspecified: Secondary | ICD-10-CM

## 2013-09-23 DIAGNOSIS — R5383 Other fatigue: Secondary | ICD-10-CM

## 2013-09-23 DIAGNOSIS — R5381 Other malaise: Secondary | ICD-10-CM

## 2013-09-23 DIAGNOSIS — L659 Nonscarring hair loss, unspecified: Secondary | ICD-10-CM

## 2013-09-23 DIAGNOSIS — E559 Vitamin D deficiency, unspecified: Secondary | ICD-10-CM

## 2013-09-23 DIAGNOSIS — R197 Diarrhea, unspecified: Secondary | ICD-10-CM

## 2013-09-23 DIAGNOSIS — K117 Disturbances of salivary secretion: Secondary | ICD-10-CM

## 2013-09-23 DIAGNOSIS — R7301 Impaired fasting glucose: Secondary | ICD-10-CM

## 2013-09-23 NOTE — Progress Notes (Signed)
Subjective:    Patient ID: Casey Farmer, female    DOB: 07/28/1970, 43 y.o.   MRN: [id number]  HPI  Casey Farmer is here today complaining of several symptoms and wants to get labs checked to make sure that she is okay.  She has been having a dry mouth and her hair is thinning. She also says that she stays very thirsty and is fatigued so she would like to have her sugar and thyroid checked.    Review of Systems  Constitutional: Positive for fatigue.  Cardiovascular: Negative for chest pain, palpitations and leg swelling.  Endocrine: Positive for polydipsia and polyuria.       Hair loss Dry mouth   Skin:       Dry skin  All other systems reviewed and are negative.    Past Medical History  Diagnosis Date  . Type 2 HSV infection of vulvovaginal region   . Heart murmur   . HSV-1 (herpes simplex virus 1) infection   . GERD (gastroesophageal reflux disease)      Past Surgical History  Procedure Laterality Date  . 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    Education: Oncologist [employer] [facility]); [employer] (Associate's Degree) IT    Tobacco Use/Exposure:  None    Alcohol Use:  Occasional   Drug Use:  None   Diet:  Regular   Exercise:  Walking and Aerobics - Twice daily   Hobbies: Shopping and cooking                     Family History  Problem Relation Age of Onset  . Hyperlipidemia Mother   . Hypertension Mother   . Thyroid disease Mother   . Hypertension Father   . Kidney disease Father     S/P Kidney Transplant   . Heart disease Father   . Cancer Maternal Grandmother   . Diabetes Maternal Aunt   . Diabetes Maternal Uncle      Allergies  Allergen Reactions  . Amoxicillin Swelling     Immunization History  Administered Date(s) Administered    . Influenza,inj,Quad PF,36+ Mos 02/02/2013       Objective:   Physical Exam  Nursing note and vitals reviewed. Constitutional: She is oriented to person, place, and time. She appears well-developed and well-nourished.  HENT:  Head: Normocephalic.  Eyes: Pupils are equal, round, and reactive to light.  Neck: Normal range of motion.  Cardiovascular: Normal rate.   Pulmonary/Chest: Effort normal.  Abdominal: Soft.  Musculoskeletal: Normal range of motion.  Neurological: She is alert and oriented to person, place, and time.  Skin: Skin is warm and dry.  Psychiatric: She has a normal mood and affect. Her behavior is normal. Judgment and thought content normal.      Assessment & Plan:    Casey Farmer was seen today for fatigue and dry mouth.  Diagnoses and associated orders for this visit:  Hair thinning - CBC w/Diff - TSH  Dry mouth  Other malaise and fatigue - CBC w/Diff - TSH - IBC panel - Vitamin B12  Diarrhea - Gliadin antibodies, serum - Tissue transglutaminase, IgA - Reticulin Antibody, IgA w reflex titer  Impaired fasting glucose - COMPLETE METABOLIC PANEL WITH GFR -  Hemoglobin A1c  Unspecified vitamin D deficiency - Vit D  25 hydroxy (rtn osteoporosis monitoring)  Other Orders - Iron   TIME SPENT "FACE TO FACE" WITH PATIENT -  20 MINS

## 2013-09-23 NOTE — Patient Instructions (Signed)
1)  Lab Results - Sign up for My Chart and you can see your results.  Follow up and we can discuss the results in detail.     Celiac Disease Celiac disease is a digestive disease that causes your body's natural defense system (immune system) to react against its own cells. It interferes with taking in (absorbing) nutrients from food. Celiac disease is also known as celiac sprue, nontropical sprue, and gluten-sensitive enteropathy. People who have celiac disease cannot tolerate gluten. Gluten is a protein found in wheat, rye, and barley. With time, celiac disease will damage the cells lining the small intestine. This leads to being unable to absorb nutrients from food (malabsorption), diarrhea, and nutritional problems. CAUSES  Celiac disease is genetic. This means you have a higher likelihood of getting the disease if someone in your family has or has had it. Up to 10% of your close relatives (parent, sibling, child) may also have the disease.  People with celiac disease tend to have other autoimmune diseases. The link may be genetic. These diseases include:  Dermatitis herpetiformis.  Thyroid disease.  Systemic lupus erythematosis.  Type 1 diabetes.  Liver disease.  Collagen vascular disease.  Rheumatoid arthritis.  Sjgren's syndrome. SYMPTOMS  The symptoms of celiac disease vary from person to person. The symptoms are generally digestive or nutritional. Digestive symptoms include:  Recurring belly (abdominal) bloating and pain.  Gas.  Long-term (chronic) diarrhea.  Pale, bad-smelling, greasy, or oily stool. Nutritional symptoms include:  Failure to thrive in infants.  Delayed growth in children.  Weight loss in children and adults.  Missed menstrual periods (often due to extreme weight loss).  Anemia.  Weakening bones (osteoporosis).  Fatigue and weakness.  Tingling or other signs of nerve damage (peripheral neuropathy).  Depression. DIAGNOSIS  If your  symptoms and physical exam suggest that a digestive disorder or malnutrition is present, your caregiver may suspect celiac disease. You may have already begun a gluten-free diet. If symptoms persist, testing may be needed to confirm the diagnosis. Some tests are best done while you are on a normal, unrestricted diet. Tests may include:  Blood tests to check for nutritional deficiencies.  Blood tests to look for evidence that the body is producing antibodies against its own small intestine cells.  Taking a tissue sample (biopsy) from the small bowel for evaluation.  X-rays of the small bowel.  Evaluating the stool for fat.  Tests to check for nutrient absorption from the intestine. TREATMENT  It is important to seek treatment. Untreated celiac disease can cause growth problems (in children), anemia, osteoporosis, and possible nerve problems. A pregnant patient with untreated celiac disease has a higher risk of miscarriage, and the fetus has an increased risk of low birth weight and other growth problems. If celiac disease is diagnosed in the early stages, treatment can allow you to live a long, nearly symptom-free life. Treatment includes following a gluten-free diet. This means avoiding all foods that contain gluten. Eating even a small amount of gluten can damage your intestine. For most people, following this diet will stop symptoms. It will heal existing intestinal damage and prevent further damage. Improvements begin within days of starting the diet. The small intestine is usually completely healed within 3 to 6 months, or it may take up to 2 years for older adults. A small percentage of people do not improve on the gluten-free diet. Depending on your age and the stage at which you were diagnosed, some problems such as delayed growth  and discolored teeth may not improve. Sometimes, damaged intestines cannot heal. If your intestines are not absorbing enough nutrients, you may need to receive  nutrition supplements through an intravenous (IV) tube. Drug treatments are being tested for unresponsive celiac disease. In this case, you may need to be evaluated for complications of the disease. Your caregiver may also recommend:  A pneumonia vaccination.  Nutrients and other treatments for any nutritional deficiencies. Your caregiver can provide you with more information on a gluten-free diet. Discussion with a dietician skilled in this illness will be valuable. Support groups may also be helpful. HOME CARE INSTRUCTIONS   Focus on a gluten-free diet. This diet must become a way of life.  Monitor your response to the gluten-free diet and treat any nutritional deficiencies.  Prepare ahead of time if you decide to eat outside the home.  Make and keep your regular follow-up visits with your caregiver.  Suggest to family members that they get screened for early signs of the disease. SEEK MEDICAL CARE IF:   You continue to have digestive symptoms (gas, cramping, diarrhea) despite a proper diet.  You have trouble sticking to the gluten-free diet.  You develop an itchy rash with groups of tiny blisters.  You develop severe weakness, balance problems, menstrual problems, or depression. Document Released: 04/09/2005 Document Revised: 07/02/2011 Document Reviewed: 07/27/2009 [facility] Patient Information 2014 [place], [place].

## 2013-09-24 LAB — COMPLETE METABOLIC PANEL WITH GFR
ALT: 16 U/L (ref 0–35)
AST: 18 U/L (ref 0–37)
Albumin: 3.7 g/dL (ref 3.5–5.2)
Alkaline Phosphatase: 57 U/L (ref 39–117)
BUN: 11 mg/dL (ref 6–23)
CO2: 28 mEq/L (ref 19–32)
Calcium: 9.1 mg/dL (ref 8.4–10.5)
Chloride: 107 mEq/L (ref 96–112)
Creat: 0.82 mg/dL (ref 0.50–1.10)
GFR, Est African American: >89 mL/min
GFR, Est Non African American: 89 mL/min
Glucose, Bld: 85 mg/dL (ref 70–99)
Potassium: 4.1 mEq/L (ref 3.5–5.3)
Sodium: 141 mEq/L (ref 135–145)
Total Bilirubin: 0.4 mg/dL (ref 0.2–1.2)
Total Protein: 6.7 g/dL (ref 6.0–8.3)

## 2013-09-24 LAB — CBC WITH DIFFERENTIAL/PLATELET
Basophils Absolute: 0 10*3/uL (ref 0.0–0.1)
Basophils Relative: 1 % (ref 0–1)
Eosinophils Absolute: 0.1 10*3/uL (ref 0.0–0.7)
Eosinophils Relative: 3 % (ref 0–5)
HCT: 36.8 % (ref 36.0–46.0)
Hemoglobin: 12.4 g/dL (ref 12.0–15.0)
Lymphocytes Relative: 48 % — ABNORMAL HIGH (ref 12–46)
Lymphs Abs: 2.1 10*3/uL (ref 0.7–4.0)
MCH: 30.3 pg (ref 26.0–34.0)
MCHC: 33.7 g/dL (ref 30.0–36.0)
MCV: 90 fL (ref 78.0–100.0)
Monocytes Absolute: 0.3 10*3/uL (ref 0.1–1.0)
Monocytes Relative: 7 % (ref 3–12)
Neutro Abs: 1.8 10*3/uL (ref 1.7–7.7)
Neutrophils Relative %: 41 % — ABNORMAL LOW (ref 43–77)
Platelets: 238 10*3/uL (ref 150–400)
RBC: 4.09 MIL/uL (ref 3.87–5.11)
RDW: 13.8 % (ref 11.5–15.5)
WBC: 4.4 10*3/uL (ref 4.0–10.5)

## 2013-09-24 LAB — IBC PANEL
%SAT: 19 % — ABNORMAL LOW (ref 20–55)
TIBC: 311 ug/dL (ref 250–470)
UIBC: 251 ug/dL (ref 125–400)

## 2013-09-24 LAB — IRON: Iron: 60 ug/dL (ref 42–145)

## 2013-09-24 LAB — HEMOGLOBIN A1C
Hgb A1c MFr Bld: 5.3 % (ref <5.7)
Mean Plasma Glucose: 105 mg/dL (ref <117)

## 2013-09-25 LAB — RETICULIN ANTIBODIES, IGA W TITER: Reticulin Ab, IgA: NEGATIVE

## 2013-09-25 LAB — GLIADIN ANTIBODIES, SERUM
Gliadin IgA: 11 U/mL (ref <20)
Gliadin IgG: 19 U/mL (ref <20)

## 2013-09-25 LAB — VITAMIN B12: Vitamin B-12: 771 pg/mL (ref 211–911)

## 2013-09-25 LAB — TSH: TSH: 0.822 u[IU]/mL (ref 0.350–4.500)

## 2013-09-25 LAB — VITAMIN D 25 HYDROXY (VIT D DEFICIENCY, FRACTURES): Vit D, 25-Hydroxy: 51 ng/mL (ref 30–89)

## 2013-09-25 LAB — TISSUE TRANSGLUTAMINASE, IGA: Tissue Transglutaminase Ab, IgA: 3.2 U/mL (ref <20)

## 2013-09-28 ENCOUNTER — Ambulatory Visit (INDEPENDENT_AMBULATORY_CARE_PROVIDER_SITE_OTHER): Payer: BC Managed Care – PPO | Admitting: Family Medicine

## 2013-09-28 ENCOUNTER — Encounter: Payer: Self-pay | Admitting: Family Medicine

## 2013-09-28 VITALS — BP 106/66 | HR 82 | Resp 16 | Ht 63.0 in | Wt 175.0 lb

## 2013-09-28 DIAGNOSIS — L659 Nonscarring hair loss, unspecified: Secondary | ICD-10-CM

## 2013-09-28 DIAGNOSIS — R350 Frequency of micturition: Secondary | ICD-10-CM

## 2013-09-28 LAB — POCT URINALYSIS DIPSTICK
Bilirubin, UA: NEGATIVE
Blood, UA: NEGATIVE
Glucose, UA: NEGATIVE
Ketones, UA: NEGATIVE
Leukocytes, UA: NEGATIVE
Nitrite, UA: NEGATIVE
Protein, UA: NEGATIVE
Spec Grav, UA: 1.01
Urobilinogen, UA: NEGATIVE
pH, UA: 6.5

## 2013-09-28 MED ORDER — POLYSACCHARIDE IRON COMPLEX 150 MG PO CAPS: 150.0000 mg | ORAL_CAPSULE | Freq: Two times a day (BID) | ORAL | Status: AC

## 2013-09-28 MED ORDER — BIOTIN 5000 MCG PO CAPS
1.0000 | ORAL_CAPSULE | Freq: Every day | ORAL | Status: AC
Start: 2013-09-28 — End: 2014-09-29

## 2013-09-28 MED ORDER — MULTIGEN PLUS 151-60-1 MG PO TABS: 1.0000 | ORAL_TABLET | Freq: Two times a day (BID) | ORAL | Status: AC

## 2013-09-28 NOTE — Patient Instructions (Signed)
1)  Hair Loss - Try the Multigen Plus 1-2 per day for 3 months.  At that time, we'll recheck your iron level as well as your iron saturation.  The goal is to get your saturation up closer to 55%.  If you don't like the Multigen or if it is too costly then you can try the Nu-Iron.   You can also add the Biotin.  You can also add a prenatal vitamin to help your hair and to also provide folic acid in case you become pregnant.

## 2013-09-28 NOTE — Progress Notes (Signed)
Subjective:    Patient ID: Casey Farmer, female    DOB: 30-Aug-1970, 43 y.o.   MRN: [id number]  HPI  Casey Farmer is here today to go over her recent lab results.  She continues to have multiple complaints of dry mouth, urinary frequency, skin rash and hair loss.  She thinks that she is allergic to some foods.     Review of Systems  Cardiovascular: Negative for chest pain, palpitations and leg swelling.  Endocrine: Positive for polydipsia and polyuria.       Hair loss Dry mouth   Skin:       Dry skin  All other systems reviewed and are negative.    Past Medical History  Diagnosis Date  . Type 2 HSV infection of vulvovaginal region   . Heart murmur   . HSV-1 (herpes simplex virus 1) infection   . GERD (gastroesophageal reflux disease)   . DVT (deep venous thrombosis)   . Baker's cyst of knee      Past Surgical History  Procedure Laterality Date  . 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    Education: Oncologist [employer]); [employer] (Associate's Degree) IT    Tobacco Use/Exposure:  None    Alcohol Use:  Occasional   Drug Use:  None   Diet:  Regular   Exercise:  Walking and Aerobics - Twice daily   Hobbies: Shopping and cooking                     Family History  Problem Relation Age of Onset  . Hyperlipidemia Mother   . Hypertension Mother   . Thyroid disease Mother   . Hypertension Father   . Kidney disease Father     S/P Kidney Transplant   . Heart disease Father   . Cancer Maternal Grandmother   . Diabetes Maternal Aunt   . Diabetes Maternal Uncle      Current Outpatient Prescriptions on File Prior to Visit  Medication Sig Dispense Refill  . cetirizine (ZYRTEC) 10 MG tablet Take 10 mg by mouth daily.       No current  facility-administered medications on file prior to visit.     Allergies  Allergen Reactions  . Amoxicillin Swelling     Immunization History  Administered Date(s) Administered  . Influenza,inj,Quad PF,36+ Mos 02/02/2013       Objective:   Physical Exam  Nursing note and vitals reviewed. Constitutional: She is oriented to person, place, and time. She appears well-developed and well-nourished.  HENT:  Head: Normocephalic.  Eyes: Pupils are equal, round, and reactive to light.  Neck: Normal range of motion.  Cardiovascular: Normal rate.   Pulmonary/Chest: Effort normal.  Abdominal: Soft.  Musculoskeletal: Normal range of motion.  Neurological: She is alert and oriented to person, place, and time.  Skin: Skin is warm and dry.  Psychiatric: She has a normal mood and affect. Her behavior is normal. Judgment and thought content normal.      Assessment & Plan:    Ileyah was seen today for lab results.  Diagnoses and associated orders for this visit:  Hair loss - FeAsp-FeFum -Suc-C-Thre-B12-FA (MULTIGEN PLUS) 50-101-1 MG TABS; Take 1 tablet by mouth 2 (two) times daily. -  iron polysaccharides (NU-IRON) 150 MG capsule; Take 1 capsule (150 mg total) by mouth 2 (two) times daily. - Biotin 5000 MCG CAPS; Take 1 capsule (5,000 mcg total) by mouth daily. - Iron; Future - IBC panel; Future  Urine frequency - POCT urinalysis dipstick

## 2013-11-18 ENCOUNTER — Emergency Department (HOSPITAL_BASED_OUTPATIENT_CLINIC_OR_DEPARTMENT_OTHER): Payer: BC Managed Care – PPO

## 2013-11-18 ENCOUNTER — Encounter (HOSPITAL_BASED_OUTPATIENT_CLINIC_OR_DEPARTMENT_OTHER): Payer: Self-pay | Admitting: Emergency Medicine

## 2013-11-18 ENCOUNTER — Emergency Department (HOSPITAL_BASED_OUTPATIENT_CLINIC_OR_DEPARTMENT_OTHER)
Admission: EM | Admit: 2013-11-18 | Discharge: 2013-11-18 | Disposition: A | Discharge status: Home / Self Care | Payer: BC Managed Care – PPO | Attending: Emergency Medicine | Admitting: Emergency Medicine

## 2013-11-18 DIAGNOSIS — M7989 Other specified soft tissue disorders: Secondary | ICD-10-CM | POA: Insufficient documentation

## 2013-11-18 DIAGNOSIS — Z79899 Other long term (current) drug therapy: Secondary | ICD-10-CM | POA: Insufficient documentation

## 2013-11-18 DIAGNOSIS — Z8619 Personal history of other infectious and parasitic diseases: Secondary | ICD-10-CM | POA: Insufficient documentation

## 2013-11-18 DIAGNOSIS — Z86718 Personal history of other venous thrombosis and embolism: Secondary | ICD-10-CM | POA: Insufficient documentation

## 2013-11-18 DIAGNOSIS — Z3202 Encounter for pregnancy test, result negative: Secondary | ICD-10-CM | POA: Insufficient documentation

## 2013-11-18 DIAGNOSIS — K219 Gastro-esophageal reflux disease without esophagitis: Secondary | ICD-10-CM | POA: Insufficient documentation

## 2013-11-18 DIAGNOSIS — Z880 Allergy status to penicillin: Secondary | ICD-10-CM | POA: Insufficient documentation

## 2013-11-18 DIAGNOSIS — R011 Cardiac murmur, unspecified: Secondary | ICD-10-CM | POA: Insufficient documentation

## 2013-11-18 DIAGNOSIS — Z8739 Personal history of other diseases of the musculoskeletal system and connective tissue: Secondary | ICD-10-CM | POA: Insufficient documentation

## 2013-11-18 HISTORY — DX: Acute embolism and thrombosis of unspecified deep veins of unspecified lower extremity: I82.409

## 2013-11-18 HISTORY — DX: Synovial cyst of popliteal space (Baker), unspecified knee: M71.20

## 2013-11-18 LAB — URINALYSIS, ROUTINE W REFLEX MICROSCOPIC
BILIRUBIN URINE: NEGATIVE
Glucose, UA: NEGATIVE mg/dL
HGB URINE DIPSTICK: NEGATIVE
Ketones, ur: NEGATIVE mg/dL
Leukocytes, UA: NEGATIVE
Nitrite: NEGATIVE
PH: 6 (ref 5.0–8.0)
Protein, ur: NEGATIVE mg/dL
SPECIFIC GRAVITY, URINE: 1.01 (ref 1.005–1.030)
Urobilinogen, UA: 0.2 mg/dL (ref 0.0–1.0)

## 2013-11-18 LAB — PREGNANCY, URINE: Preg Test, Ur: NEGATIVE

## 2013-11-18 NOTE — Discharge Instructions (Signed)
Peripheral Edema °You have swelling in your legs (peripheral edema). This swelling is due to excess accumulation of salt and water in your body. Edema may be a sign of heart, kidney or liver disease, or a side effect of a medication. It may also be due to problems in the leg veins. Elevating your legs and using special support stockings may be very helpful, if the cause of the swelling is due to poor venous circulation. Avoid long periods of standing, whatever the cause. °Treatment of edema depends on identifying the cause. Chips, pretzels, pickles and other salty foods should be avoided. Restricting salt in your diet is almost always needed. Water pills (diuretics) are often used to remove the excess salt and water from your body via urine. These medicines prevent the kidney from reabsorbing sodium. This increases urine flow. °Diuretic treatment may also result in lowering of potassium levels in your body. Potassium supplements may be needed if you have to use diuretics daily. Daily weights can help you keep track of your progress in clearing your edema. You should call your caregiver for follow up care as recommended. °SEEK IMMEDIATE MEDICAL CARE IF:  °· You have increased swelling, pain, redness, or heat in your legs. °· You develop shortness of breath, especially when lying down. °· You develop chest or abdominal pain, weakness, or fainting. °· You have a fever. °Document Released: 05/17/2004 Document Revised: 07/02/2011 Document Reviewed: 04/27/2009 °ExitCare® Patient Information ©2015 ExitCare, LLC. This information is not intended to replace advice given to you by your health care provider. Make sure you discuss any questions you have with your health care provider. ° °

## 2013-11-18 NOTE — ED Provider Notes (Signed)
CSN: [id number]     Arrival date & time 11/18/13  1546 History   First MD Initiated Contact with Patient 11/18/13 1556     Chief Complaint  Patient presents with  . Leg Swelling     (Consider location/radiation/quality/duration/timing/severity/associated sxs/prior Treatment) Patient is a 43 y.o. female presenting with leg pain. The history is provided by the patient.  Leg Pain Location:  Leg Time since incident:  4 weeks Injury: no   Leg location:  L lower leg Pain details:    Quality: no pain.   Radiates to:  Does not radiate   Severity:  Mild   Onset quality:  Gradual   Duration:  1 month   Timing:  Intermittent   Progression:  Unchanged Chronicity:  Recurrent Dislocation: no   Foreign body present:  No foreign bodies Prior injury to area:  Yes Relieved by: elevating the LLE. Worsened by:  Nothing tried Ineffective treatments:  None tried Associated symptoms: no back pain, no fatigue, no fever and no neck pain     Past Medical History  Diagnosis Date  . Type 2 HSV infection of vulvovaginal region   . Heart murmur   . HSV-1 (herpes simplex virus 1) infection   . GERD (gastroesophageal reflux disease)   . DVT (deep venous thrombosis)   . Baker's cyst of knee    Past Surgical History  Procedure Laterality Date  . Bunionectomy Bilateral    Family History  Problem Relation Age of Onset  . Hyperlipidemia Mother   . Hypertension Mother   . Thyroid disease Mother   . Hypertension Father   . Kidney disease Father     S/P Kidney Transplant   . Heart disease Father   . Cancer Maternal Grandmother   . Diabetes Maternal Aunt   . Diabetes Maternal Uncle    History  Substance Use Topics  . Smoking status: Never Smoker   . Smokeless tobacco: Never Used  . Alcohol Use: Yes     Comment: Occasionally   OB History   Grav Para Term Preterm Abortions TAB SAB Ect Mult Living                 Review of Systems  Constitutional: Negative for fever and fatigue.  HENT:  Negative for congestion and drooling.   Eyes: Negative for pain.  Respiratory: Negative for cough and shortness of breath.   Cardiovascular: Negative for chest pain.  Gastrointestinal: Negative for nausea, vomiting, abdominal pain and diarrhea.  Genitourinary: Negative for dysuria and hematuria.  Musculoskeletal: Negative for back pain, gait problem and neck pain.  Skin: Negative for color change.  Neurological: Negative for dizziness and headaches.  Hematological: Negative for adenopathy.  Psychiatric/Behavioral: Negative for behavioral problems.  All other systems reviewed and are negative.     Allergies  Amoxicillin  Home Medications   Prior to Admission medications   Medication Sig Start Date End Date Taking? Authorizing Provider  Biotin 5000 MCG CAPS Take 1 capsule (5,000 mcg total) by mouth daily. 09/28/13 09/29/14  Gillian Scarce, MD  cetirizine (ZYRTEC) 10 MG tablet Take 10 mg by mouth daily.    Historical Provider, MD  FeAsp-FeFum -Suc-C-Thre-B12-FA (MULTIGEN PLUS) 50-101-1 MG TABS Take 1 tablet by mouth 2 (two) times daily. 09/28/13 09/29/14  Gillian Scarce, MD  iron polysaccharides (NU-IRON) 150 MG capsule Take 1 capsule (150 mg total) by mouth 2 (two) times daily. 09/28/13 09/29/14  Gillian Scarce, MD  omeprazole (PRILOSEC) 40 MG capsule Take 40  mg by mouth daily.    Historical Provider, MD   BP 115/77  Pulse 88  Temp(Src) 98 F (36.7 C) (Oral)  Resp 16  Ht 5\' 3"  (1.6 m)  Wt 177 lb (80.287 kg)  BMI 31.36 kg/m2  SpO2 100%  LMP 11/06/2013 Physical Exam  Nursing note and vitals reviewed. Constitutional: She is oriented to person, place, and time. She appears well-developed and well-nourished.  HENT:  Head: Normocephalic.  Mouth/Throat: Oropharynx is clear and moist. No oropharyngeal exudate.  Eyes: Conjunctivae and EOM are normal. Pupils are equal, round, and reactive to light.  Neck: Normal range of motion. Neck supple.  Cardiovascular: Normal rate, regular rhythm, normal  heart sounds and intact distal pulses.  Exam reveals no gallop and no friction rub.   No murmur heard. Pulmonary/Chest: Effort normal and breath sounds normal. No respiratory distress. She has no wheezes.  Abdominal: Soft. Bowel sounds are normal. There is no tenderness. There is no rebound and no guarding.  Musculoskeletal: Normal range of motion. She exhibits edema. She exhibits no tenderness.  Mild pitting edema of the left lower extremity extending from the foot to the mid shin.  2+ distal pulses in the lower extremities.  No focal tenderness of the lower extremities.   Neurological: She is alert and oriented to person, place, and time.  Skin: Skin is warm and dry.  Psychiatric: She has a normal mood and affect. Her behavior is normal.    ED Course  Procedures (including critical care time) Labs Review Labs Reviewed  URINALYSIS, ROUTINE W REFLEX MICROSCOPIC - Abnormal; Notable for the following:    APPearance CLOUDY (*)    All other components within normal limits  PREGNANCY, URINE    Imaging Review Dg Chest 2 View  11/18/2013   CLINICAL DATA:  Left lower leg swelling.  EXAM: CHEST  2 VIEW  COMPARISON:  None.  FINDINGS: The heart size and mediastinal contours are within normal limits. Both lungs are clear. No pneumothorax or pleural effusion is noted. The visualized skeletal structures are unremarkable.  IMPRESSION: No acute cardiopulmonary abnormality seen.   Electronically Signed   By: Roque Lias M.D.   On: 11/18/2013 16:38   US Venous Img Lower Unilateral Left  11/18/2013   CLINICAL DATA:  Ankle swelling  EXAM: Left LOWER EXTREMITY VENOUS DOPPLER ULTRASOUND  TECHNIQUE: Gray-scale sonography with graded compression, as well as color Doppler and duplex ultrasound were performed to evaluate the lower extremity deep venous systems from the level of the common femoral vein and including the common femoral, femoral, profunda femoral, popliteal and calf veins including the posterior  tibial, peroneal and gastrocnemius veins when visible. The superficial great saphenous vein was also interrogated. Spectral Doppler was utilized to evaluate flow at rest and with distal augmentation maneuvers in the common femoral, femoral and popliteal veins.  COMPARISON:  None.  FINDINGS: Common Femoral Vein: No evidence of thrombus. Normal compressibility, respiratory phasicity and response to augmentation.  Saphenofemoral Junction: No evidence of thrombus. Normal compressibility and flow on color Doppler imaging.  Profunda Femoral Vein: No evidence of thrombus. Normal compressibility and flow on color Doppler imaging.  Femoral Vein: No evidence of thrombus. Normal compressibility, respiratory phasicity and response to augmentation.  Popliteal Vein: No evidence of thrombus. Normal compressibility, respiratory phasicity and response to augmentation.  Calf Veins: No evidence of thrombus. Normal compressibility and flow on color Doppler imaging.  Superficial Great Saphenous Vein: No evidence of thrombus. Normal compressibility and flow on color Doppler imaging.  Venous Reflux:  None.  Other Findings: An area of palpable abnormality in the anterior she an, there is a 9 mm hypoechoic collection likely representing a small hematoma.  IMPRESSION: No evidence of deep venous thrombosis.  Likely hematoma in the anterior shin.   Electronically Signed   By: Alcide CleverMark  Lukens M.D.   On: 11/18/2013 17:02     EKG Interpretation   Date/Time:  Wednesday November 18 2013 16:18:00 EDT Ventricular Rate:  87 PR Interval:  138 QRS Duration: 78 QT Interval:  348 QTC Calculation: 418 R Axis:   35 Text Interpretation:  Normal sinus rhythm Nonspecific T wave abnormality  Confirmed by Ngai Parcell  MD, Nicodemus Denk ([id number]) on 11/18/2013 4:37:27 PM      MDM   Final diagnoses:  Left leg swelling    4:14 PM 43 y.o. female who presents with intermittent left lower extremity swelling over the last month. She states that the swelling goes  away with elevation of the extremity. The swelling is localized to the foot ankle and shin. She denies any pain. She denies any injury. She denies any history of DVT. She does state that she has a history of some mild mitral valve regurgitation. She denies any chest pain or shortness of breath. She is afebrile and vital signs are unremarkable here. Will get screening chest x-ray, EKG, and urinalysis. I have a low suspicion for DVT but the patient is requesting an ultrasound. This is reasonable, I will order the ultrasound.  5:54 PM: I interpreted/reviewed the labs and/or imaging which were non-contributory.  US neg for DVT. Likely d/t venous insufficiency.  I have discussed the diagnosis/risks/treatment options with the patient and believe the pt to be eligible for discharge home to follow-up with her pcp as needed. We also discussed returning to the ED immediately if new or worsening sx occur. We discussed the sx which are most concerning (e.g., worsening swelling, pain, fever, redness, swelling) that necessitate immediate return. Medications administered to the patient during their visit and any new prescriptions provided to the patient are listed below.  Medications given during this visit Medications - No data to display  New Prescriptions   No medications on file       Junius ArgyleForrest S Oluwaseun Bruyere, MD 11/18/13 1754

## 2013-11-18 NOTE — ED Notes (Signed)
C/o swelling to LLE x 1 month-denies specific injury

## 2013-11-20 ENCOUNTER — Telehealth: Payer: Self-pay

## 2013-11-20 NOTE — Telephone Encounter (Signed)
Casey Farmer called and wanted to know if we could print out or call in her medicine Famvir 500 mg or Voltrex 1000 mg, I went ahead and made her an appointment for Thursday, but I got the impression she needed them now.

## 2013-11-26 ENCOUNTER — Encounter: Payer: Self-pay | Admitting: Family Medicine

## 2013-11-26 ENCOUNTER — Ambulatory Visit (INDEPENDENT_AMBULATORY_CARE_PROVIDER_SITE_OTHER): Payer: BC Managed Care – PPO | Admitting: Family Medicine

## 2013-11-26 ENCOUNTER — Ambulatory Visit (HOSPITAL_BASED_OUTPATIENT_CLINIC_OR_DEPARTMENT_OTHER)
Admission: RE | Admit: 2013-11-26 | Discharge: 2013-11-26 | Disposition: A | Discharge status: Home / Self Care | Payer: BC Managed Care – PPO | Source: Ambulatory Visit | Attending: Family Medicine | Admitting: Family Medicine

## 2013-11-26 VITALS — BP 109/74 | HR 84 | Resp 16 | Wt 182.0 lb

## 2013-11-26 DIAGNOSIS — M25473 Effusion, unspecified ankle: Secondary | ICD-10-CM

## 2013-11-26 DIAGNOSIS — M25472 Effusion, left ankle: Secondary | ICD-10-CM

## 2013-11-26 DIAGNOSIS — M25476 Effusion, unspecified foot: Secondary | ICD-10-CM

## 2013-11-26 DIAGNOSIS — M25579 Pain in unspecified ankle and joints of unspecified foot: Secondary | ICD-10-CM | POA: Insufficient documentation

## 2013-11-26 DIAGNOSIS — M25572 Pain in left ankle and joints of left foot: Secondary | ICD-10-CM

## 2013-11-26 MED ORDER — FUROSEMIDE 40 MG PO TABS: 40.0000 mg | ORAL_TABLET | Freq: Every day | ORAL | Status: AC

## 2013-11-26 NOTE — Progress Notes (Signed)
Subjective:    Patient ID: Casey Farmer, female    DOB: [birth year], 43 y.o.   MRN: [id number]  HPI  Casey Farmer is here today complaining of pain and swelling in her left ankle. She started having this pain about 2 months ago. She went to an urgent care and they checked her for a blood clot which was negative. She says that if she elevates it and puts ice on it, the swelling goes down.    Review of Systems  Constitutional: Negative for activity change, appetite change and fatigue.  Cardiovascular: Positive for leg swelling. Negative for chest pain and palpitations.  Musculoskeletal:       Pain in left ankle and swelling  All other systems reviewed and are negative.    Past Medical History  Diagnosis Date  . Type 2 HSV infection of vulvovaginal region   . Heart murmur   . HSV-1 (herpes simplex virus 1) infection   . GERD (gastroesophageal reflux disease)   . DVT (deep venous thrombosis)   . Baker's cyst of knee      Past Surgical History  Procedure Laterality Date  . 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    Education: OncologistBachelor's Degree [employer] (Associate's [employer]) IT    Tobacco Use/Exposure:  None    Alcohol Use:  Occasional   Drug Use:  None   Diet:  Regular   Exercise:  Walking and Aerobics - Twice daily   Hobbies: Shopping and cooking                     Family History  Problem Relation Age of Onset  . Hyperlipidemia Mother   . Hypertension Mother   . Thyroid disease Mother   . Hypertension Father   . Kidney disease Father     S/P Kidney Transplant   . Heart disease Father   . Cancer Maternal Grandmother   . Diabetes Maternal Aunt   . Diabetes Maternal Uncle      Current Outpatient Prescriptions on File Prior to Visit    Medication Sig Dispense Refill  . Biotin 5000 MCG CAPS Take 1 capsule (5,000 mcg total) by mouth daily.  30 capsule  11  . cetirizine (ZYRTEC) 10 MG tablet Take 10 mg by mouth daily.      . FeAsp-FeFum -Suc-C-Thre-B12-FA (MULTIGEN PLUS) 50-101-1 MG TABS Take 1 tablet by mouth 2 (two) times daily.  60 each  11  . iron polysaccharides (NU-IRON) 150 MG capsule Take 1 capsule (150 mg total) by mouth 2 (two) times daily.  60 capsule  11   No current facility-administered medications on file prior to visit.     Allergies  Allergen Reactions  . Amoxicillin Swelling     Immunization History  Administered Date(s) Administered  . Influenza,inj,Quad PF,36+ Mos 02/02/2013       Objective:   Physical Exam  Constitutional: She is oriented to person, place, and time. She appears well-nourished.  HENT:  Head: Normocephalic.  Cardiovascular: Normal rate.   Musculoskeletal: Normal range of motion.  Neurological: She is alert and oriented to person, place, and time.  Skin: Skin is warm.  Psychiatric: She has a normal mood and affect. Her behavior is normal.  Judgment and thought content normal.      Assessment & Plan:    Casey Farmer was seen today for ankle pain.  Diagnoses and associated orders for this visit:  Left ankle swelling - DG Ankle Complete Left - furosemide (LASIX) 40 MG tablet; Take 1 tablet (40 mg total) by mouth daily.  Left ankle pain - DG Ankle Complete Left

## 2015-10-11 IMAGING — CR DG CHEST 2V
2 series · 2 of 2 positions shown · non-contrast
Comparison: None.

CLINICAL DATA: Left lower leg swelling.

EXAM:
CHEST  2 VIEW

[w chest pa]
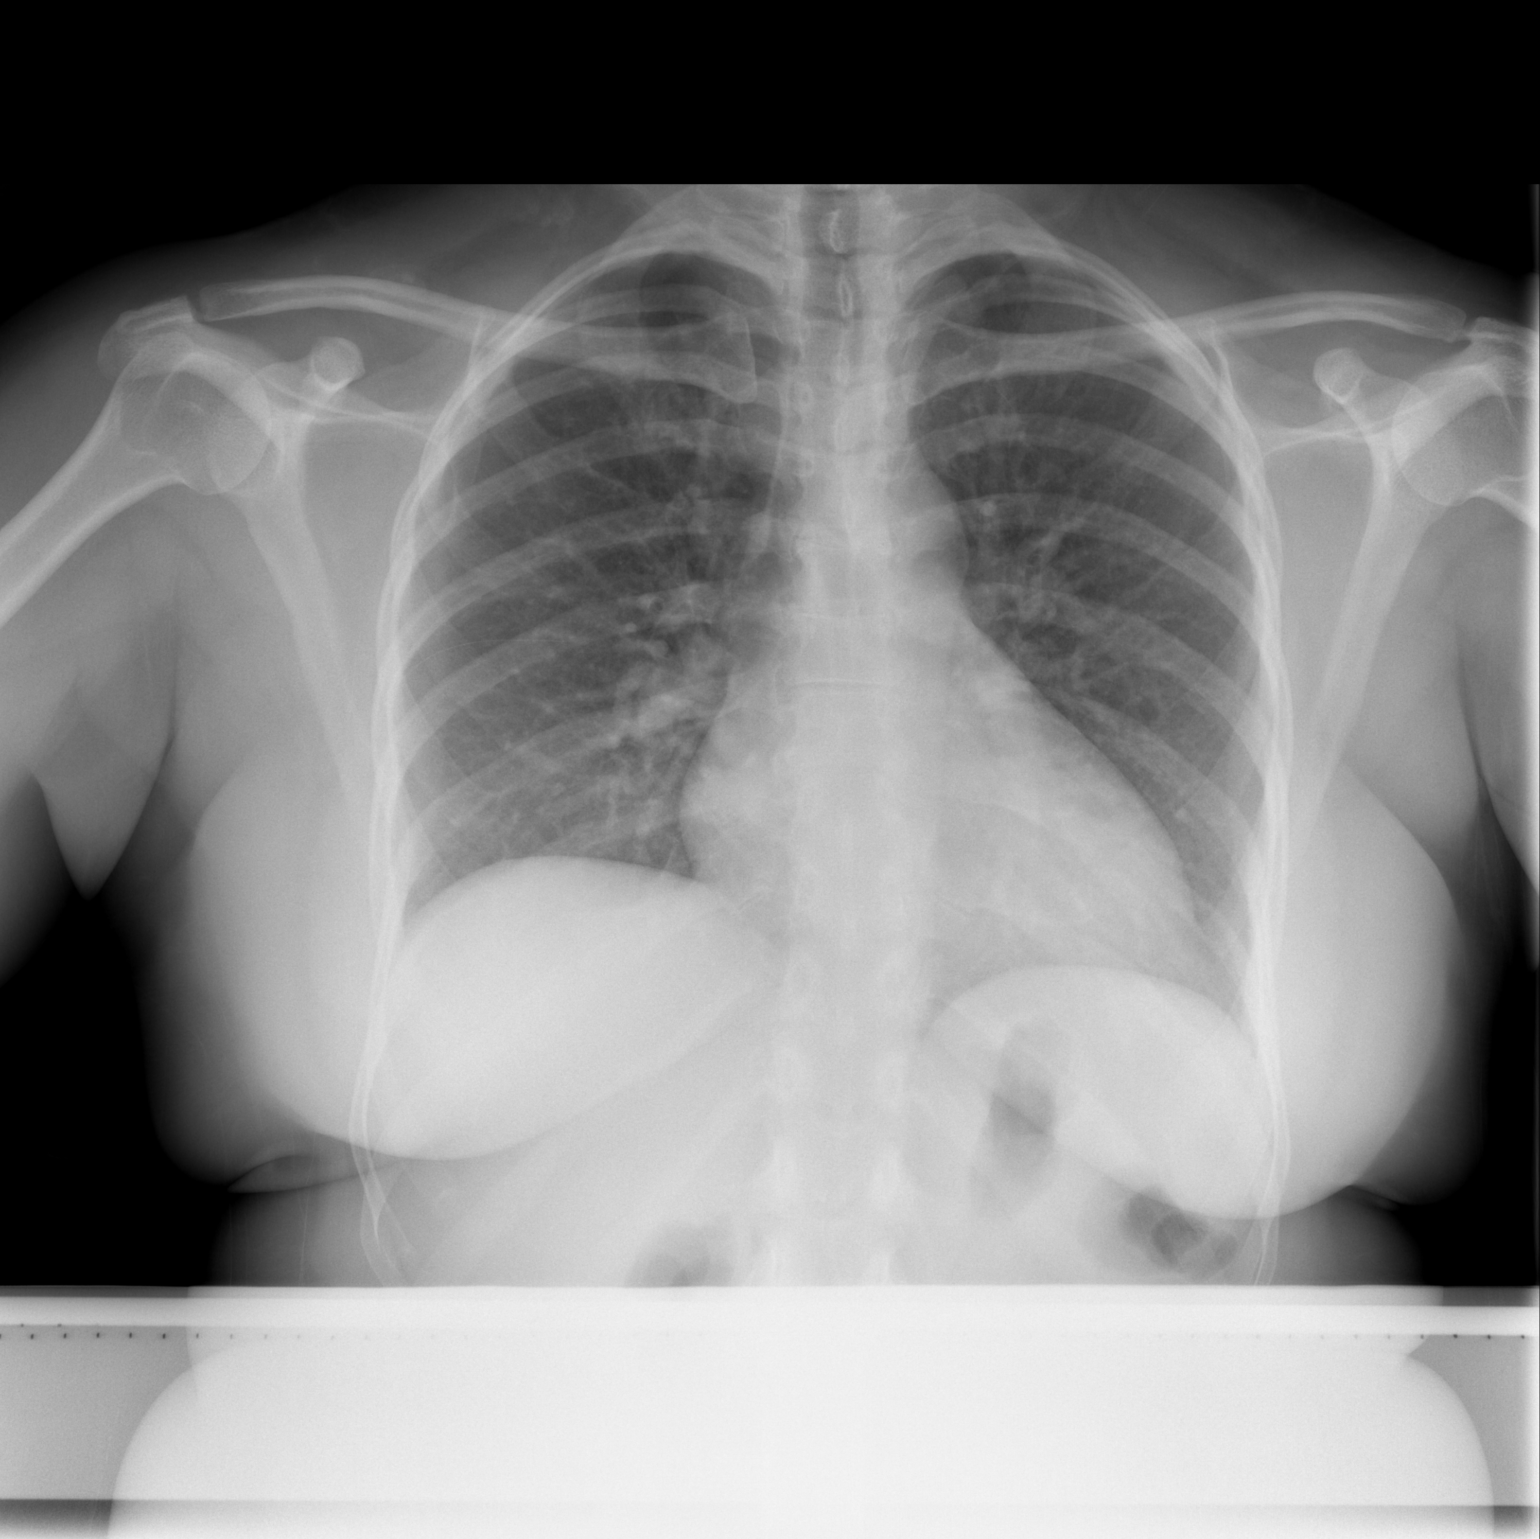

[w chest lat]
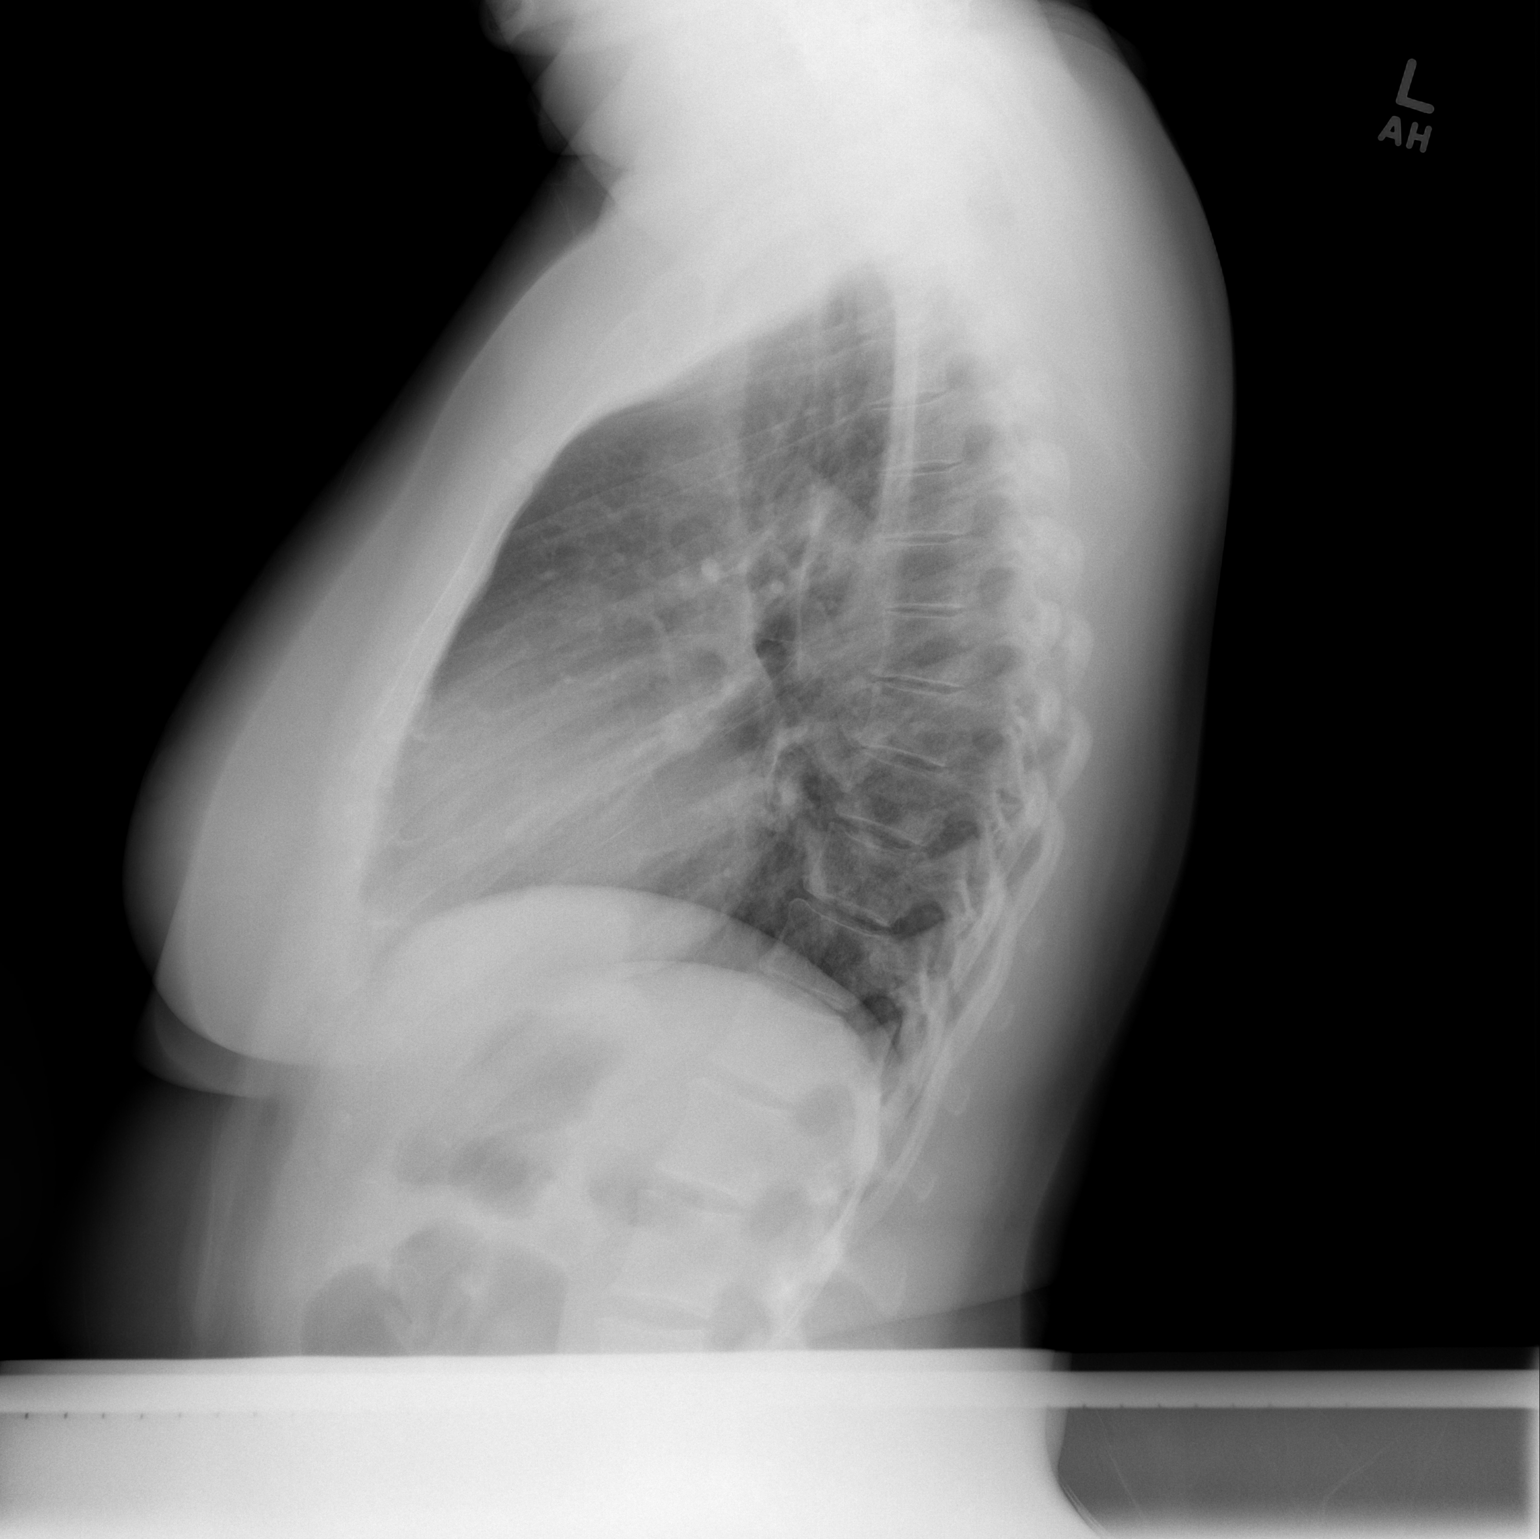

[2 of 2 positions shown; findings below may reference images not displayed]

FINDINGS: The heart size and mediastinal contours are within normal limits.
Both lungs are clear. No pneumothorax or pleural effusion is noted.
The visualized skeletal structures are unremarkable.
IMPRESSION: No acute cardiopulmonary abnormality seen.

## 2017-12-11 ENCOUNTER — Encounter: Payer: Self-pay | Admitting: Pharmacist

## 2021-09-14 ENCOUNTER — Encounter (HOSPITAL_BASED_OUTPATIENT_CLINIC_OR_DEPARTMENT_OTHER): Payer: Self-pay | Admitting: Emergency Medicine

## 2021-09-14 ENCOUNTER — Emergency Department (HOSPITAL_BASED_OUTPATIENT_CLINIC_OR_DEPARTMENT_OTHER)
Admission: EM | Admit: 2021-09-14 | Discharge: 2021-09-14 | Disposition: A | Discharge status: Home / Self Care | Payer: BC Managed Care – PPO | Attending: Emergency Medicine | Admitting: Emergency Medicine

## 2021-09-14 ENCOUNTER — Emergency Department (HOSPITAL_BASED_OUTPATIENT_CLINIC_OR_DEPARTMENT_OTHER): Payer: BC Managed Care – PPO

## 2021-09-14 ENCOUNTER — Other Ambulatory Visit: Payer: Self-pay

## 2021-09-14 DIAGNOSIS — M79661 Pain in right lower leg: Secondary | ICD-10-CM | POA: Insufficient documentation

## 2021-09-14 DIAGNOSIS — M79604 Pain in right leg: Secondary | ICD-10-CM

## 2021-09-14 NOTE — Discharge Instructions (Signed)
Please follow-up with your primary care provider regarding your symptoms as well as this visit today.  If you develop any new or worsening symptoms please come back to the emergency department.  It was a pleasure to meet you.

## 2021-09-14 NOTE — ED Triage Notes (Signed)
Pt has vein deficiency that causes legs to swell,had a masage and started to have pain during massage of right leg. Here for evaluation for blood clot.

## 2021-09-15 NOTE — ED Provider Notes (Signed)
[facility] Provider Note   CSN: [id number] Arrival date & time: 09/14/21  1945     History  Chief Complaint  Patient presents with   Leg Pain    Casey Farmer is a 51 y.o. female.  HPI Patient is a 51 year old female with a history of venous insufficiency who presents to the emergency department due to right lower leg pain.  Patient states that she is followed by a vein specialist for venous insufficiency.  She states that today she was getting a massage of the lower legs and began experiencing tenderness in the right lower leg.  Due to this she was concerned of DVT and came to the emergency department for further evaluation.  Denies any fevers, chest pain, shortness of breath.    Home Medications Prior to Admission medications   Medication Sig Start Date End Date Taking? Authorizing Provider  cetirizine (ZYRTEC) 10 MG tablet Take 10 mg by mouth daily.    [provider]  furosemide (LASIX) 40 MG tablet Take 1 tablet (40 mg total) by mouth daily. 11/26/13 11/26/14  Jonathon Resides, MD  iron polysaccharides (NU-IRON) 150 MG capsule Take 1 capsule (150 mg total) by mouth 2 (two) times daily. 09/28/13 09/29/14  Jonathon Resides, MD      Allergies    Amoxicillin    Review of Systems   Review of Systems  All other systems reviewed and are negative. Ten systems reviewed and are negative for acute change, except as noted in the HPI.   Physical Exam Updated Vital Signs BP 113/68   Pulse 76   Temp 98.4 F (36.9 C) (Oral)   Resp 16   Ht 5' 3.5" (1.613 m)   Wt 94.8 kg   SpO2 97%   BMI 36.44 kg/m  Physical Exam Vitals and nursing note reviewed.  Constitutional:      General: She is not in acute distress.    Appearance: Normal appearance. She is not ill-appearing, toxic-appearing or diaphoretic.  HENT:     Head: Normocephalic and atraumatic.     Right Ear: External ear normal.     Left Ear: External ear normal.     Nose: Nose normal.      Mouth/Throat:     Mouth: Mucous membranes are moist.     Pharynx: Oropharynx is clear. No oropharyngeal exudate or posterior oropharyngeal erythema.  Eyes:     Extraocular Movements: Extraocular movements intact.  Cardiovascular:     Rate and Rhythm: Normal rate and regular rhythm.     Pulses: Normal pulses.     Heart sounds: Normal heart sounds. No murmur heard.   No friction rub. No gallop.  Pulmonary:     Effort: Pulmonary effort is normal. No respiratory distress.     Breath sounds: Normal breath sounds. No stridor. No wheezing, rhonchi or rales.  Abdominal:     General: Abdomen is flat. There is no distension.  Musculoskeletal:        General: Normal range of motion.     Cervical back: Normal range of motion and neck supple. No tenderness.     Right lower leg: Edema present.     Left lower leg: Edema present.     Comments: 1+ pitting edema noted in the bilateral lower extremities.  Mild tenderness noted along the distal right lower leg overlying the distal tibia.  No erythema or increased warmth noted.  Distal sensation intact.  Palpable pedal pulses.  Skin:    General:  Skin is warm and dry.  Neurological:     General: No focal deficit present.     Mental Status: She is alert and oriented to person, place, and time.  Psychiatric:        Mood and Affect: Mood normal.        Behavior: Behavior normal.   ED Results / Procedures / Treatments   Labs (all labs ordered are listed, but only abnormal results are displayed) Labs Reviewed - No data to display  EKG None  Radiology US Venous Img Lower Unilateral Right  Result Date: 09/14/2021 CLINICAL DATA:  Evaluate for DVT. EXAM: RIGHT LOWER EXTREMITY VENOUS DOPPLER ULTRASOUND TECHNIQUE: Gray-scale sonography with compression, as well as color and duplex ultrasound, were performed to evaluate the deep venous system(s) from the level of the common femoral vein through the popliteal and proximal calf veins. COMPARISON:  None  Available. FINDINGS: VENOUS Normal compressibility of the common femoral, superficial femoral, and popliteal veins, as well as the visualized calf veins. Visualized portions of profunda femoral vein and great saphenous vein unremarkable. No filling defects to suggest DVT on grayscale or color Doppler imaging. Doppler waveforms show normal direction of venous flow, normal respiratory plasticity and response to augmentation. Limited views of the contralateral common femoral vein are unremarkable. OTHER None. Limitations: none IMPRESSION: No evidence for DVT in the right lower extremity. Electronically Signed   By: Ronney Asters M.D.   On: 09/14/2021 21:10    Procedures Procedures   Medications Ordered in ED Medications - No data to display  ED Course/ Medical Decision Making/ A&P                           Medical Decision Making Pt is a 51 y.o. female with a history of venous insufficiency who presents to the emergency department due to right lower leg pain that began earlier today.  Imaging: DVT ultrasound of the right leg shows no evidence for DVT in the right lower extremity.  I, Rayna Sexton, PA-C, personally reviewed and evaluated these images and lab results as part of my medical decision-making.  On my exam patient has mild tenderness overlying the distal tibia of the right leg.  Trace pedal edema noted bilaterally that appears to be symmetric.  No increased warmth or erythema noted.  DVT ultrasound was obtained in triage which shows "no evidence for DVT in the right lower extremity."  Neurovascularly intact distal to the region of pain.  Patient afebrile, nontachycardic, and nontoxic-appearing.  Doubt infectious etiology at this time.  Appears musculoskeletal in nature.  Patient appears stable for discharge at this time and she is agreeable.  Recommended compression stockings, ice, as well as Tylenol/Motrin as needed for management of her pain and leg swelling.  Recommended follow-up  with her PCP.  Discussed return precautions.  Her questions were answered and she was amicable at the time of discharge.  Note: Portions of this report may have been transcribed using voice recognition software. Every effort was made to ensure accuracy; however, inadvertent computerized transcription errors may be present.   Final Clinical Impression(s) / ED Diagnoses Final diagnoses:  Right leg pain   Rx / DC Orders ED Discharge Orders     None         Rayna Sexton, PA-C 09/15/21 [place], MD 09/16/21 1620

## 2021-11-26 ENCOUNTER — Encounter: Payer: Self-pay | Admitting: Emergency Medicine

## 2021-11-26 ENCOUNTER — Ambulatory Visit
Admission: EM | Admit: 2021-11-26 | Discharge: 2021-11-26 | Disposition: A | Discharge status: Home / Self Care | Payer: BC Managed Care – PPO | Attending: Urgent Care | Admitting: Urgent Care

## 2021-11-26 DIAGNOSIS — B009 Herpesviral infection, unspecified: Secondary | ICD-10-CM

## 2021-11-26 DIAGNOSIS — R21 Rash and other nonspecific skin eruption: Secondary | ICD-10-CM | POA: Diagnosis not present

## 2021-11-26 DIAGNOSIS — L03116 Cellulitis of left lower limb: Secondary | ICD-10-CM | POA: Diagnosis not present

## 2021-11-26 MED ORDER — DOXYCYCLINE HYCLATE 100 MG PO CAPS: 100.0000 mg | ORAL_CAPSULE | Freq: Two times a day (BID) | ORAL | 0 refills | Status: AC

## 2021-11-26 MED ORDER — VALACYCLOVIR HCL 1 G PO TABS: 1000.0000 mg | ORAL_TABLET | Freq: Three times a day (TID) | ORAL | 0 refills | Status: AC

## 2021-11-26 NOTE — ED Provider Notes (Signed)
[facility]   MRN: [id number] DOB: 1970-11-26  Subjective:   Casey Farmer is a 51 y.o. female presenting for 3 day history of acute onset left knee rash. It is slightly painful, sees some blistering. Has a history of HSV 1, HSV 2 infection.  Reports that she gets this on the skin of her right buttock.  Denies fever, trauma, known insect bite or sting, wound.  She just finished a course of amoxicillin for her teeth.  No rash anywhere else on her body.  No drainage of pus or bleeding.  No current facility-administered medications for this encounter.  Current Outpatient Medications:    cetirizine (ZYRTEC) 10 MG tablet, Take 10 mg by mouth daily., Disp: , Rfl:    furosemide (LASIX) 40 MG tablet, Take 1 tablet (40 mg total) by mouth daily., Disp: 30 tablet, Rfl: 1   iron polysaccharides (NU-IRON) 150 MG capsule, Take 1 capsule (150 mg total) by mouth 2 (two) times daily., Disp: 60 capsule, Rfl: 11   Allergies  Allergen Reactions   Amoxicillin Swelling    Past Medical History:  Diagnosis Date   Baker's cyst of knee    DVT (deep venous thrombosis) (HCC)    GERD (gastroesophageal reflux disease)    HSV-1 (herpes simplex virus 1) infection    Type 2 HSV infection of vulvovaginal region      Past Surgical History:  Procedure Laterality Date   BUNIONECTOMY Bilateral     Family History  Problem Relation Age of Onset   Hyperlipidemia Mother    Hypertension Mother    Thyroid disease Mother    Hypertension Father    Kidney disease Father        S/P Kidney Transplant    Heart disease Father    Cancer Maternal Grandmother    Diabetes Maternal Aunt    Diabetes Maternal Uncle     Social History   Tobacco Use   Smoking status: Never   Smokeless tobacco: Never  Substance Use Topics   Alcohol use: Yes    Comment: Occasionally   Drug use: No    ROS   Objective:   Vitals: BP 121/82 (BP Location: Right Arm)   Pulse 71   Temp 97.9 F (36.6 C)  (Oral)   SpO2 95%   Physical Exam Constitutional:      General: She is not in acute distress.    Appearance: Normal appearance. She is well-developed. She is not ill-appearing, toxic-appearing or diaphoretic.  HENT:     Head: Normocephalic and atraumatic.     Nose: Nose normal.     Mouth/Throat:     Mouth: Mucous membranes are moist.  Eyes:     General: No scleral icterus.       Right eye: No discharge.        Left eye: No discharge.     Extraocular Movements: Extraocular movements intact.  Cardiovascular:     Rate and Rhythm: Normal rate.  Pulmonary:     Effort: Pulmonary effort is normal.  Skin:    General: Skin is warm and dry.     Findings: Rash (Area of erythema with warmth and tenderness overlying the left knee; there are also vesicular lesions in clusters superiorly) present.  Neurological:     General: No focal deficit present.     Mental Status: She is alert and oriented to person, place, and time.  Psychiatric:        Mood and Affect: Mood normal.  Behavior: Behavior normal.        Assessment and Plan :   PDMP not reviewed this encounter.  1. Cellulitis of left lower extremity   2. Rash   3. Herpes simplex infection    Patient has signs of cellulitis and as this could lead to worsening infection including septic arthritis I advised that we cover for this with doxycycline. However, given the vesicular lesions and her history of HSV1 and HSV2 infections recommended covering for this as well with Valtrex. Follow up with PCP. Counseled patient on potential for adverse effects with medications prescribed/recommended today, ER and return-to-clinic precautions discussed, patient verbalized understanding.    Wallis Bamberg, [place] 11/26/21 1218

## 2021-11-26 NOTE — ED Triage Notes (Addendum)
Pt here with blistering rash to left knee x 3 days. Pt had chicken pox as child. No shingles vaccine

## 2023-08-07 IMAGING — US US EXTREM LOW VENOUS*R*
1 series · 14 of 24 positions shown · non-contrast
Comparison: None Available.

CLINICAL DATA: Evaluate for DVT.

EXAM:
RIGHT LOWER EXTREMITY VENOUS DOPPLER ULTRASOUND
TECHNIQUE: Gray-scale sonography with compression, as well as color and duplex
ultrasound, were performed to evaluate the deep venous system(s)
from the level of the common femoral vein through the popliteal and
proximal calf veins.

[Series 1: us extrem low venous*right* · 14 of 33 slices shown]
[im 1/33]
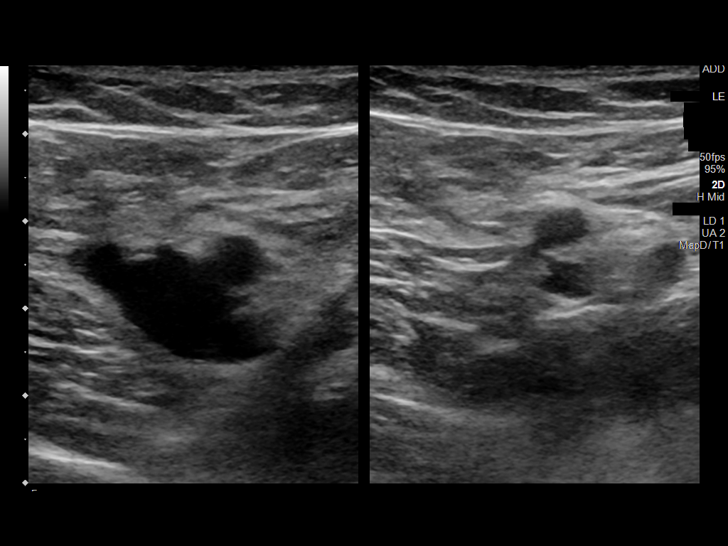
[im 3/33]
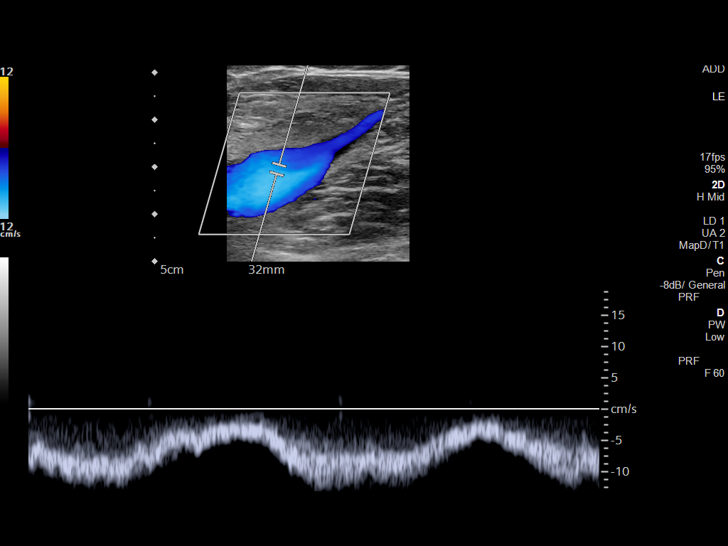
[im 6/33]
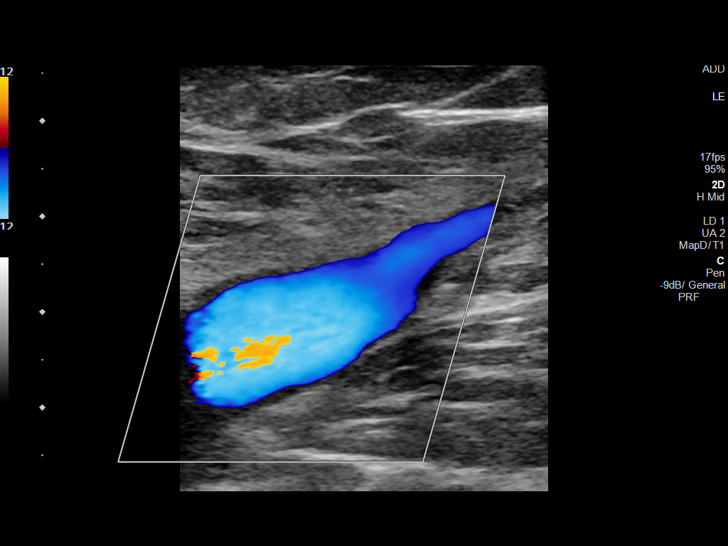
[im 9/33]
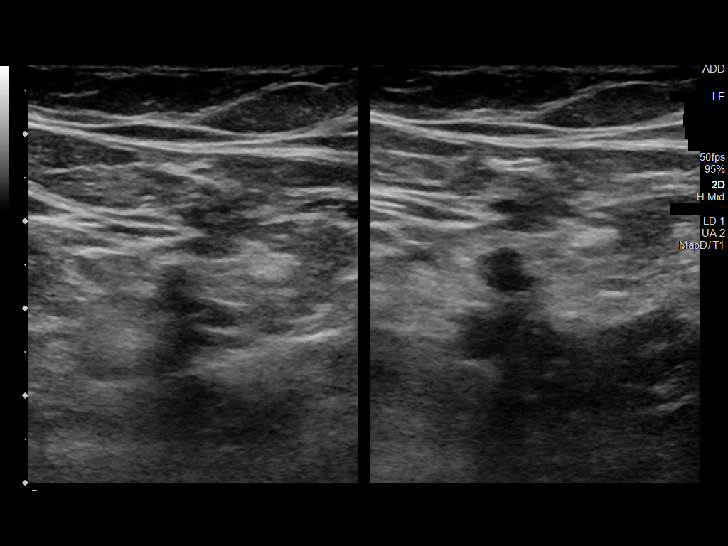
[im 10/33]
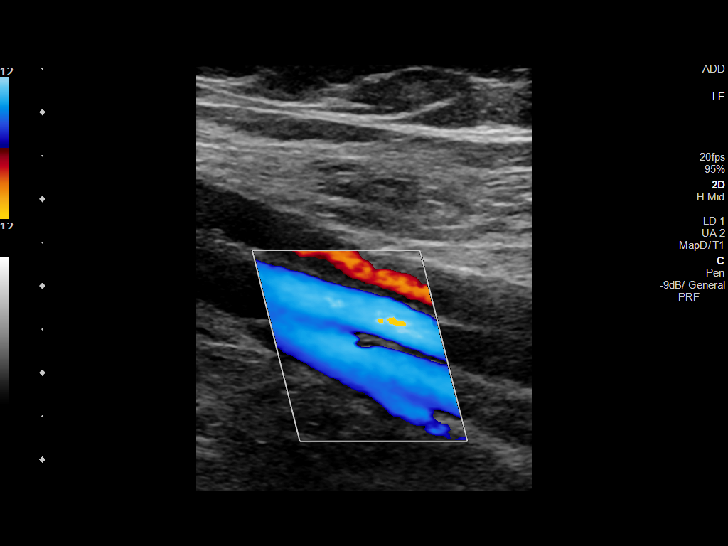
[im 13/33]
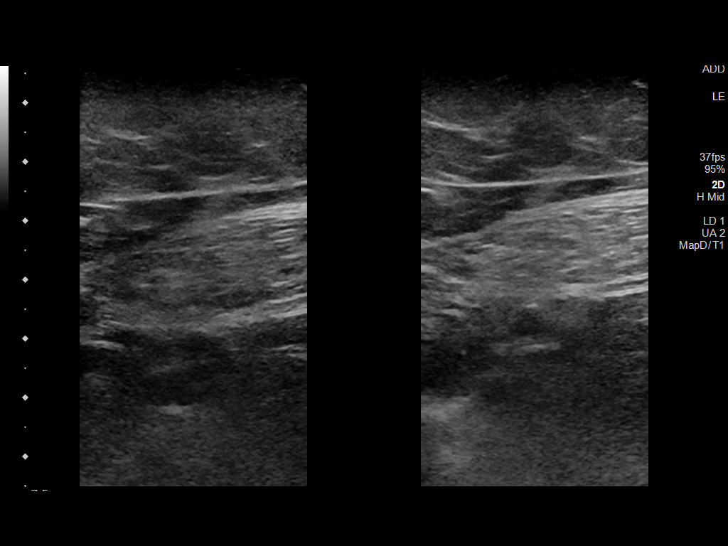
[im 16/33]
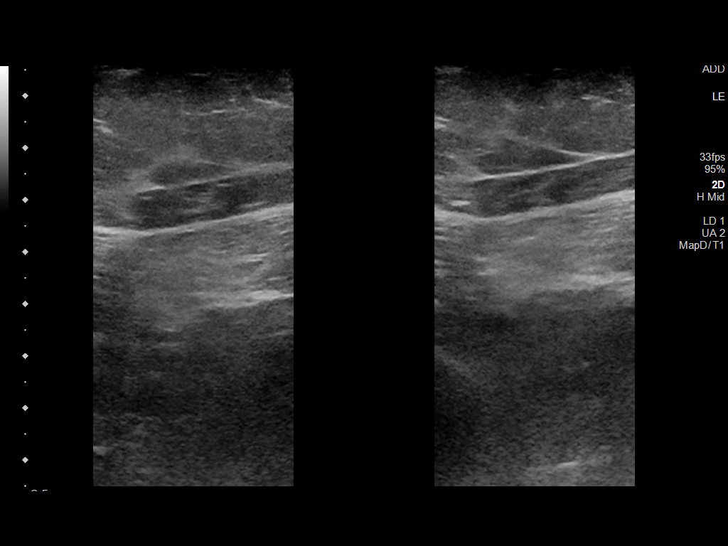
[im 17/33]
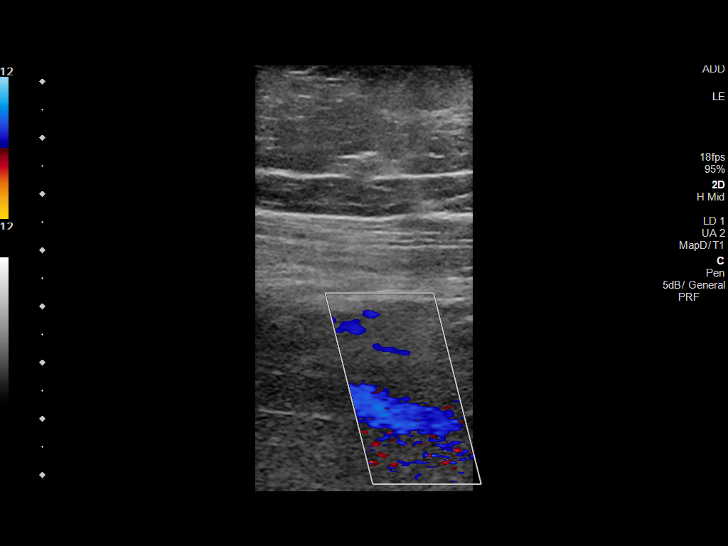
[im 20/33]
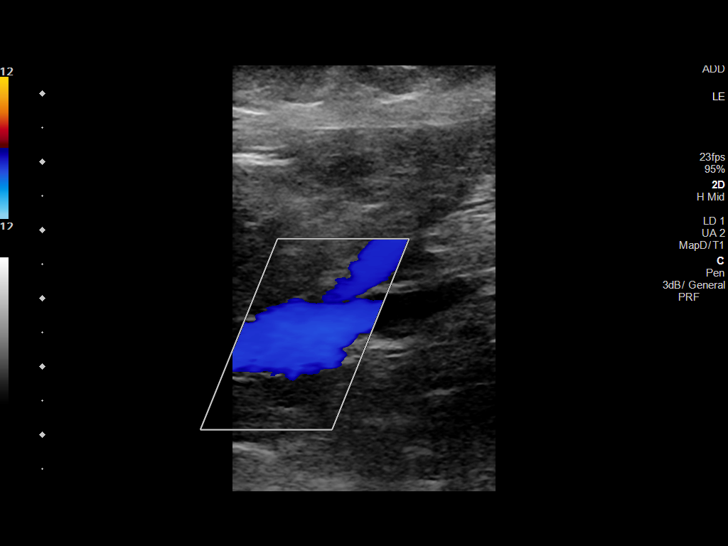
[im 23/33]
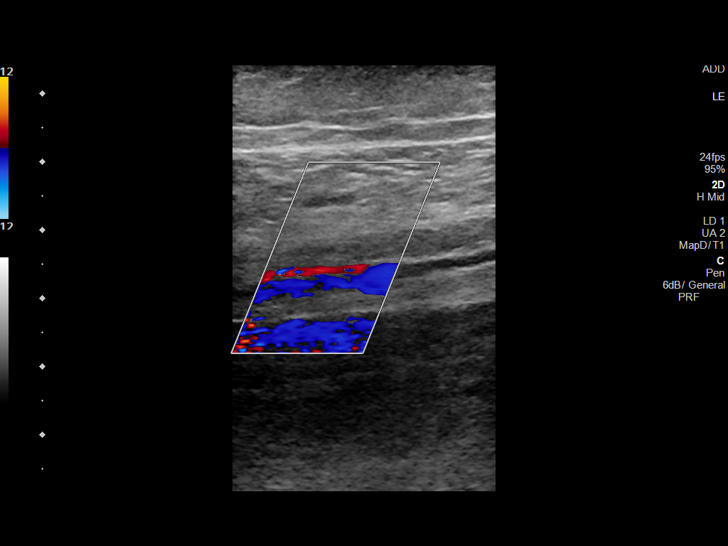
[im 26/33]
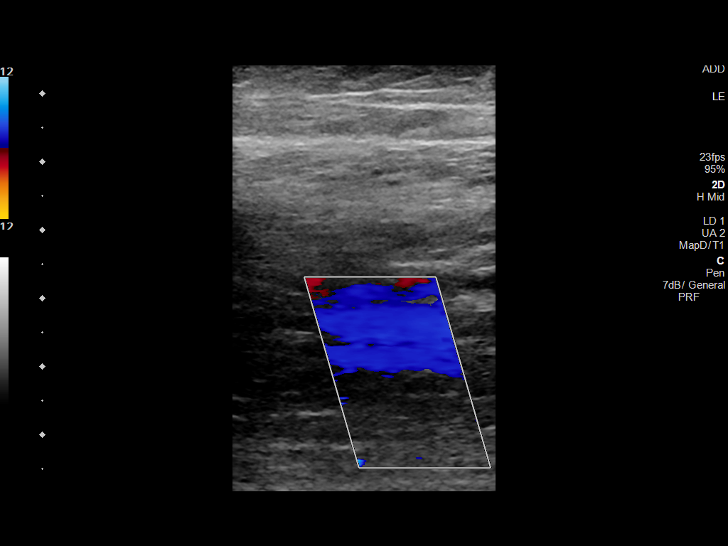
[im 27/33]
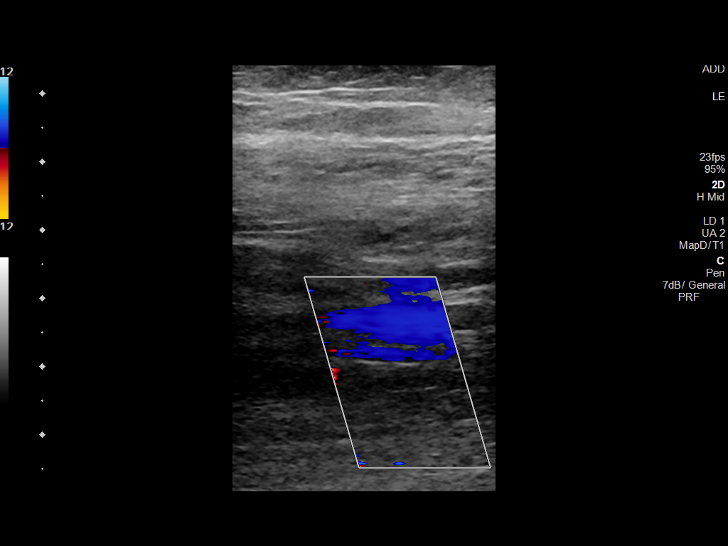
[im 30/33]
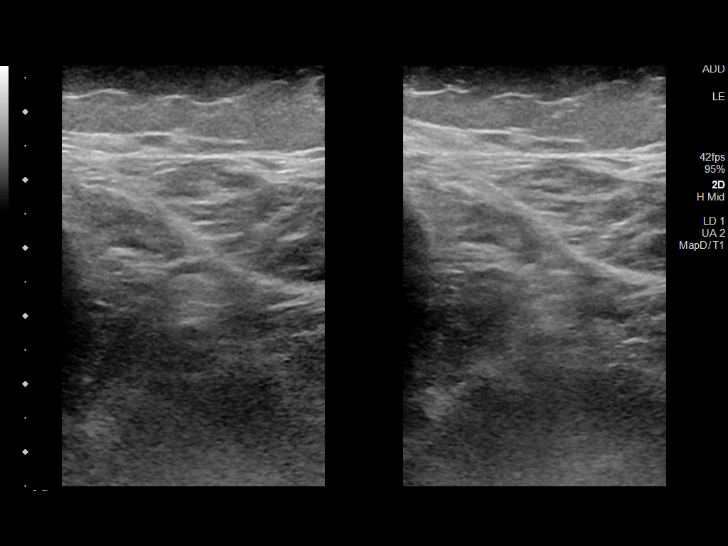
[im 33/33]
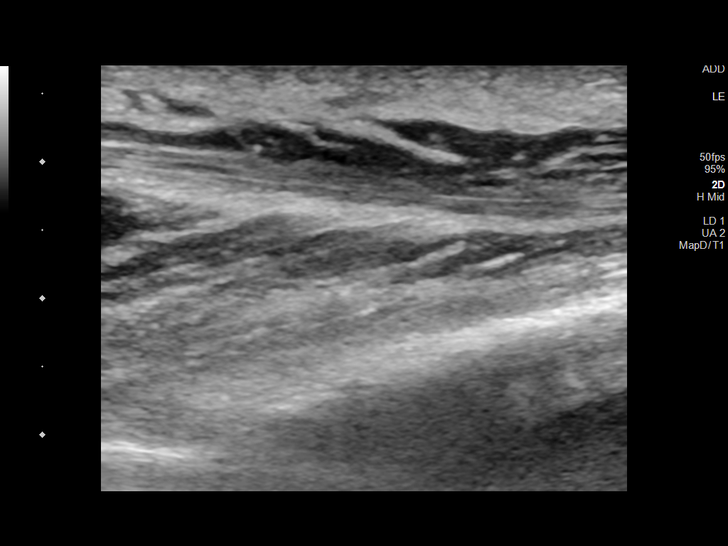

[14 of 24 positions shown; findings below may reference images not displayed]

FINDINGS: VENOUS

Normal compressibility of the common femoral, superficial femoral,
and popliteal veins, as well as the visualized calf veins.
Visualized portions of profunda femoral vein and great saphenous
vein unremarkable. No filling defects to suggest DVT on grayscale or
color Doppler imaging. Doppler waveforms show normal direction of
venous flow, normal respiratory plasticity and response to
augmentation.

Limited views of the contralateral common femoral vein are
unremarkable.

OTHER

None.

Limitations: none
IMPRESSION: No evidence for DVT in the right lower extremity.
